# Patient Record
Sex: Male | Born: 1975 | Race: White | Hispanic: No | Marital: Single | Smoking: Former smoker
Health system: Southern US, Community
[De-identification: ages and names within clinical notes are randomized; demographics above are authoritative.]

## PROBLEM LIST (undated history)

## (undated) DIAGNOSIS — G373 Acute transverse myelitis in demyelinating disease of central nervous system: Secondary | ICD-10-CM

## (undated) DIAGNOSIS — N319 Neuromuscular dysfunction of bladder, unspecified: Secondary | ICD-10-CM

## (undated) HISTORY — DX: Acute transverse myelitis in demyelinating disease of central nervous system: G37.3

## (undated) HISTORY — DX: Neuromuscular dysfunction of bladder, unspecified: N31.9

---
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
## 2015-08-19 ENCOUNTER — Inpatient Hospital Stay (HOSPITAL_COMMUNITY)
Admission: EM | Admit: 2015-08-19 | Discharge: 2015-08-23 | DRG: 098 | Disposition: A | Discharge status: Home / Self Care | Payer: Commercial Managed Care - PPO | Attending: Internal Medicine | Admitting: Internal Medicine

## 2015-08-19 ENCOUNTER — Emergency Department (HOSPITAL_COMMUNITY): Payer: Commercial Managed Care - PPO

## 2015-08-19 ENCOUNTER — Encounter (HOSPITAL_COMMUNITY): Payer: Self-pay | Admitting: Emergency Medicine

## 2015-08-19 DIAGNOSIS — R200 Anesthesia of skin: Secondary | ICD-10-CM

## 2015-08-19 DIAGNOSIS — R531 Weakness: Secondary | ICD-10-CM | POA: Diagnosis present

## 2015-08-19 DIAGNOSIS — K5900 Constipation, unspecified: Secondary | ICD-10-CM | POA: Diagnosis present

## 2015-08-19 DIAGNOSIS — A770 Spotted fever due to Rickettsia rickettsii: Secondary | ICD-10-CM | POA: Diagnosis present

## 2015-08-19 DIAGNOSIS — G373 Acute transverse myelitis in demyelinating disease of central nervous system: Secondary | ICD-10-CM | POA: Diagnosis present

## 2015-08-19 DIAGNOSIS — R29898 Other symptoms and signs involving the musculoskeletal system: Secondary | ICD-10-CM

## 2015-08-19 LAB — TYPE AND SCREEN
ABO/RH(D): O POS
Antibody Screen: NEGATIVE

## 2015-08-19 LAB — COMPREHENSIVE METABOLIC PANEL
ALT: 16 U/L — AB (ref 17–63)
ANION GAP: 9 (ref 5–15)
AST: 17 U/L (ref 15–41)
Albumin: 4.1 g/dL (ref 3.5–5.0)
Alkaline Phosphatase: 46 U/L (ref 38–126)
BUN: 8 mg/dL (ref 6–20)
CHLORIDE: 111 mmol/L (ref 101–111)
CO2: 22 mmol/L (ref 22–32)
CREATININE: 0.87 mg/dL (ref 0.61–1.24)
Calcium: 9.3 mg/dL (ref 8.9–10.3)
Glucose, Bld: 101 mg/dL — ABNORMAL HIGH (ref 65–99)
Potassium: 4 mmol/L (ref 3.5–5.1)
Sodium: 142 mmol/L (ref 135–145)
Total Bilirubin: 0.8 mg/dL (ref 0.3–1.2)
Total Protein: 6.7 g/dL (ref 6.5–8.1)

## 2015-08-19 LAB — CBC WITH DIFFERENTIAL/PLATELET
Basophils Absolute: 0 10*3/uL (ref 0.0–0.1)
Basophils Relative: 1 %
EOS ABS: 0.1 10*3/uL (ref 0.0–0.7)
EOS PCT: 2 %
HCT: 42.6 % (ref 39.0–52.0)
Hemoglobin: 14.6 g/dL (ref 13.0–17.0)
LYMPHS ABS: 1.3 10*3/uL (ref 0.7–4.0)
LYMPHS PCT: 23 %
MCH: 31.1 pg (ref 26.0–34.0)
MCHC: 34.3 g/dL (ref 30.0–36.0)
MCV: 90.8 fL (ref 78.0–100.0)
MONO ABS: 0.5 10*3/uL (ref 0.1–1.0)
MONOS PCT: 9 %
Neutro Abs: 3.6 10*3/uL (ref 1.7–7.7)
Neutrophils Relative %: 65 %
PLATELETS: 230 10*3/uL (ref 150–400)
RBC: 4.69 MIL/uL (ref 4.22–5.81)
RDW: 13.1 % (ref 11.5–15.5)
WBC: 5.4 10*3/uL (ref 4.0–10.5)

## 2015-08-19 LAB — URINALYSIS, ROUTINE W REFLEX MICROSCOPIC
BILIRUBIN URINE: NEGATIVE
Glucose, UA: NEGATIVE mg/dL
HGB URINE DIPSTICK: NEGATIVE
Ketones, ur: NEGATIVE mg/dL
Leukocytes, UA: NEGATIVE
Nitrite: NEGATIVE
PH: 7.5 (ref 5.0–8.0)
Protein, ur: NEGATIVE mg/dL
SPECIFIC GRAVITY, URINE: 1.024 (ref 1.005–1.030)

## 2015-08-19 LAB — ABO/RH: ABO/RH(D): O POS

## 2015-08-19 LAB — PROTIME-INR
INR: 1.05 (ref 0.00–1.49)
PROTHROMBIN TIME: 13.9 s (ref 11.6–15.2)

## 2015-08-19 LAB — SEDIMENTATION RATE: Sed Rate: 2 mm/hr (ref 0–16)

## 2015-08-19 LAB — APTT: APTT: 28 s (ref 24–37)

## 2015-08-19 MED ORDER — DOXYCYCLINE HYCLATE 100 MG PO TABS: 100.0000 mg | ORAL_TABLET | Freq: Two times a day (BID) | ORAL | Status: DC

## 2015-08-19 MED ORDER — LIDOCAINE HCL (PF) 1 % IJ SOLN
INTRAMUSCULAR | Status: AC
  Administered 2015-08-19: 5 mL
  Filled 2015-08-19: qty 5

## 2015-08-19 MED ORDER — SODIUM CHLORIDE 0.9 % IV SOLN
500.0000 mg | Freq: Two times a day (BID) | INTRAVENOUS | Status: AC
  Administered 2015-08-19 – 2015-08-22 (×6): 500 mg via INTRAVENOUS
  Filled 2015-08-19 (×7): qty 4

## 2015-08-19 MED ORDER — GADOBENATE DIMEGLUMINE 529 MG/ML IV SOLN
18.0000 mL | Freq: Once | INTRAVENOUS | Status: AC | PRN
  Administered 2015-08-19: 18 mL via INTRAVENOUS

## 2015-08-19 MED ORDER — DOXYCYCLINE HYCLATE 100 MG PO TABS
100.0000 mg | ORAL_TABLET | Freq: Once | ORAL | Status: AC
  Administered 2015-08-19: 100 mg via ORAL
  Filled 2015-08-19: qty 1

## 2015-08-19 MED ORDER — HEPARIN SODIUM (PORCINE) 5000 UNIT/ML IJ SOLN
5000.0000 [IU] | Freq: Three times a day (TID) | INTRAMUSCULAR | Status: DC
  Administered 2015-08-19 – 2015-08-20 (×4): 5000 [IU] via SUBCUTANEOUS
  Filled 2015-08-19 (×5): qty 1

## 2015-08-19 MED ORDER — LIDOCAINE HCL (PF) 1 % IJ SOLN
5.0000 mL | Freq: Once | INTRAMUSCULAR | Status: AC
  Administered 2015-08-19: 5 mL

## 2015-08-19 MED ORDER — DOXYCYCLINE HYCLATE 100 MG IV SOLR
100.0000 mg | Freq: Two times a day (BID) | INTRAVENOUS | Status: DC
  Administered 2015-08-19 – 2015-08-22 (×7): 100 mg via INTRAVENOUS
  Filled 2015-08-19 (×9): qty 100

## 2015-08-19 NOTE — ED Notes (Addendum)
Patient reports feet started to be numbness in right foot to calf Sunday then worked up to bottom of rib cage (tingling) and feels like left side is asleep as well. Right side worse than left. No fevers; has not been sick lately. [facility]; he hunts and gets frequent ticks bites. Other work up was negative. Xray of spine ok. Reports back soreness (like pulled muscle) after stretching yesterday which prompted left side going numb.

## 2015-08-19 NOTE — H&P (Signed)
History and Physical    Kenneth Garza ZOX:[id number] DOB: 1976/03/24 DOA: 08/19/2015  Referring MD/NP/PA: Dr. Ethelda Garza PCP: No primary care provider on file. Outpatient Specialists: None Patient coming from: Home  Chief Complaint: BLE weakness, numbness  HPI: Kenneth Garza is a 40 y.o. male with medical history significant of previously healthy.  Patient presents to the ED with progressive numbness and weakness in BLE, abdomen and everywhere below T5.  Symptoms onset Sun, progressively worsening and developing some weakness today.  Still was able to walk in to ED today.  Did have RMSF positive titers drawn yesterday.  But denies fever, rash, or any other symptoms than weakness.  ED Course: LP attempted but EDP wasn't able to get.  Neurology consulted (see their note) and recommended hospitalist admission.  Review of Systems: As per HPI otherwise 10 point review of systems negative.    History reviewed. No pertinent past medical history.  History reviewed. No pertinent past surgical history.   reports that he has never smoked. He does not have any smokeless tobacco history on file. He reports that he does not drink alcohol or use illicit drugs.  No Known Allergies  History reviewed. No pertinent family history.   Prior to Admission medications   Medication Sig Start Date End Date Taking? Authorizing Provider  magnesium oxide (MAG-OX) 400 MG tablet Take 400 mg by mouth daily.   Yes Historical Provider, MD  naproxen (NAPROSYN) 500 MG tablet Take 500 mg by mouth 2 (two) times daily with a meal.   Yes Historical Provider, MD    Physical Exam: Filed Vitals:   08/19/15 1900 08/19/15 1915 08/19/15 1930 08/19/15 1945  BP: 127/80 124/70 140/70 129/84  Pulse: 57 27 57 47  Temp:      TempSrc:      Resp:      Height:      Weight:      SpO2: 98% 98% 99% 99%      Constitutional: NAD, calm, comfortable Filed Vitals:   08/19/15 1900 08/19/15 1915 08/19/15 1930 08/19/15 1945    BP: 127/80 124/70 140/70 129/84  Pulse: 57 27 57 47  Temp:      TempSrc:      Resp:      Height:      Weight:      SpO2: 98% 98% 99% 99%   Eyes: PERRL, lids and conjunctivae normal ENMT: Mucous membranes are moist. Posterior pharynx clear of any exudate or lesions.Normal dentition.  Neck: normal, supple, no masses, no thyromegaly Respiratory: clear to auscultation bilaterally, no wheezing, no crackles. Normal respiratory effort. No accessory muscle use.  Cardiovascular: Regular rate and rhythm, no murmurs / rubs / gallops. No extremity edema. 2+ pedal pulses. No carotid bruits.  Abdomen: no tenderness, no masses palpated. No hepatosplenomegaly. Bowel sounds positive.  Musculoskeletal: no clubbing / cyanosis. No joint deformity upper and lower extremities. Good ROM, no contractures. Normal muscle tone.  Skin: no rashes, lesions, ulcers. No induration Neurologic: CN 2-12 grossly intact. Sensation intact, DTR normal. Strength 5/5 in all 4.  Psychiatric: Normal judgment and insight. Alert and oriented x 3. Normal mood.    Labs on Admission: I have personally reviewed following labs and imaging studies  CBC:  Recent Labs Lab 08/19/15 1056  WBC 5.4  NEUTROABS 3.6  HGB 14.6  HCT 42.6  MCV 90.8  PLT 230   Basic Metabolic Panel:  Recent Labs Lab 08/19/15 1056  NA 142  K 4.0  CL 111  CO2 22  GLUCOSE 101*  BUN 8  CREATININE 0.87  CALCIUM 9.3   GFR: Estimated Creatinine Clearance: 117.7 mL/min (by C-G formula based on Cr of 0.87). Liver Function Tests:  Recent Labs Lab 08/19/15 1056  AST 17  ALT 16*  ALKPHOS 46  BILITOT 0.8  PROT 6.7  ALBUMIN 4.1   No results for input(s): LIPASE, AMYLASE in the last 168 hours. No results for input(s): AMMONIA in the last 168 hours. Coagulation Profile:  Recent Labs Lab 08/19/15 1056  INR 1.05   Cardiac Enzymes: No results for input(s): CKTOTAL, CKMB, CKMBINDEX, TROPONINI in the last 168 hours. BNP (last 3 results) No  results for input(s): PROBNP in the last 8760 hours. HbA1C: No results for input(s): HGBA1C in the last 72 hours. CBG: No results for input(s): GLUCAP in the last 168 hours. Lipid Profile: No results for input(s): CHOL, HDL, LDLCALC, TRIG, CHOLHDL, LDLDIRECT in the last 72 hours. Thyroid Function Tests: No results for input(s): TSH, T4TOTAL, FREET4, T3FREE, THYROIDAB in the last 72 hours. Anemia Panel: No results for input(s): VITAMINB12, FOLATE, FERRITIN, TIBC, IRON, RETICCTPCT in the last 72 hours. Urine analysis:    Component Value Date/Time   COLORURINE YELLOW 08/19/2015 1120   APPEARANCEUR HAZY* 08/19/2015 1120   LABSPEC 1.024 08/19/2015 1120   PHURINE 7.5 08/19/2015 1120   GLUCOSEU NEGATIVE 08/19/2015 1120   HGBUR NEGATIVE 08/19/2015 1120   BILIRUBINUR NEGATIVE 08/19/2015 1120   KETONESUR NEGATIVE 08/19/2015 1120   PROTEINUR NEGATIVE 08/19/2015 1120   NITRITE NEGATIVE 08/19/2015 1120   LEUKOCYTESUR NEGATIVE 08/19/2015 1120   Sepsis Labs: @LABRCNTIP (procalcitonin:4,lacticidven:4) )No results found for this or any previous visit (from the past 240 hour(s)).   Radiological Exams on Admission: Mr Cervical Spine W Wo Contrast  08/19/2015  CLINICAL DATA:  Acute onset of lower extremity numbness 3 days ago. Mid thoracic back pain. New diagnosis of [facility] spotted fever. EXAM: MRI TOTAL SPINE WITHOUT AND WITH CONTRAST TECHNIQUE: Multisequence MR imaging of the spine from the cervical spine to the sacrum was performed prior to and following IV contrast administration for evaluation of spinal metastatic disease. CONTRAST:  18mL MULTIHANCE GADOBENATE DIMEGLUMINE 529 MG/ML IV SOLN COMPARISON:  None. FINDINGS: Cervical Findings: The visualized intracranial contents and paraspinal soft tissues are normal. There is no mass lesion or myelopathy of the cervical spinal cord. No pathologic enhancement of the spinal cord after contrast administration. Facet joints are normal throughout the  cervical spine. Craniocervical junction through C2-3:  Normal. C3-4: Uncinate osteophytes slightly narrow the right neural foramen. Otherwise normal. C4-5: Small central subligamentous disc protrusion slightly indenting the ventral aspect of the spinal cord without myelopathy. Otherwise normal. C5-6: Disc degeneration with slight disc space narrowing asymmetric to the left. Broad-based disc osteophyte complex slightly compresses the spinal cord without myelopathy. Minimum AP dimension is 7 mm. Slight bilateral foraminal narrowing. Both lateral recesses are narrowed which could affect either or both C6 nerves. C6-7: Small broad-based disc osteophyte complex without impingement. C7-T1:  Normal. Thoracic Findings: There is a small disc protrusion just to the left of midline at T6-7 indenting the ventral aspect of the left side of the spinal cord without myelopathy. Tiny disc bulge at T7-8 with disc desiccation touching the ventral aspect of the spinal cord just to the left of midline. Disc degeneration at T8-9 without disc protrusion or bulge. Small benign hemangioma in the T12 vertebral body. Thoracic spinal cord has no mass lesion or myelopathy. No pathologic enhancement after contrast administration. Lumbar Findings: No conus tip  at T12-L1. No pathologic enhancement after contrast administration. Paraspinal soft tissues are normal. L1-2 through L3-4: Normal. L4-5: Disc desiccation with slight disc space narrowing with a tiny broad-based disc bulge with no neural impingement. L5-S1: Normal. Tiny hemangioma in the left side of the L5 vertebra. IMPRESSION: 1. No significant abnormality of the lumbar spine. Slight degenerative disc disease at L4-5. 2. Small disc protrusion at T6-7 to the left of midline slightly indenting the ventral aspect of the spinal cord without myelopathy. 3. 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 Minimal compression of the spinal cord at C5-6 without myelopathy. Electronically Signed    By: Francene Boyers M.D.   On: 08/19/2015 18:47   Mr Thoracic Spine W Wo Contrast  08/19/2015  CLINICAL DATA:  Acute onset of lower extremity numbness 3 days ago. Mid thoracic back pain. New diagnosis of [facility] spotted fever. EXAM: MRI TOTAL SPINE WITHOUT AND WITH CONTRAST TECHNIQUE: Multisequence MR imaging of the spine from the cervical spine to the sacrum was performed prior to and following IV contrast administration for evaluation of spinal metastatic disease. CONTRAST:  18mL MULTIHANCE GADOBENATE DIMEGLUMINE 529 MG/ML IV SOLN COMPARISON:  None. FINDINGS: Cervical Findings: The visualized intracranial contents and paraspinal soft tissues are normal. There is no mass lesion or myelopathy of the cervical spinal cord. No pathologic enhancement of the spinal cord after contrast administration. Facet joints are normal throughout the cervical spine. Craniocervical junction through C2-3:  Normal. C3-4: Uncinate osteophytes slightly narrow the right neural foramen. Otherwise normal. C4-5: Small central subligamentous disc protrusion slightly indenting the ventral aspect of the spinal cord without myelopathy. Otherwise normal. C5-6: Disc degeneration with slight disc space narrowing asymmetric to the left. Broad-based disc osteophyte complex slightly compresses the spinal cord without myelopathy. Minimum AP dimension is 7 mm. Slight bilateral foraminal narrowing. Both lateral recesses are narrowed which could affect either or both C6 nerves. C6-7: Small broad-based disc osteophyte complex without impingement. C7-T1:  Normal. Thoracic Findings: There is a small disc protrusion just to the left of midline at T6-7 indenting the ventral aspect of the left side of the spinal cord without myelopathy. Tiny disc bulge at T7-8 with disc desiccation touching the ventral aspect of the spinal cord just to the left of midline. Disc degeneration at T8-9 without disc protrusion or bulge. Small benign hemangioma in the T12  vertebral body. Thoracic spinal cord has no mass lesion or myelopathy. No pathologic enhancement after contrast administration. Lumbar Findings: No conus tip at T12-L1. No pathologic enhancement after contrast administration. Paraspinal soft tissues are normal. L1-2 through L3-4: Normal. L4-5: Disc desiccation with slight disc space narrowing with a tiny broad-based disc bulge with no neural impingement. L5-S1: Normal. Tiny hemangioma in the left side of the L5 vertebra. IMPRESSION: 1. No significant abnormality of the lumbar spine. Slight degenerative disc disease at L4-5. 2. Small disc protrusion at T6-7 to the left of midline slightly indenting the ventral aspect of the spinal cord without myelopathy. 3. 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 Minimal compression of the spinal cord at C5-6 without myelopathy. Electronically Signed   By: Francene Boyers M.D.   On: 08/19/2015 18:47   Mr Lumbar Spine W Wo Contrast  08/19/2015  CLINICAL DATA:  Acute onset of lower extremity numbness 3 days ago. Mid thoracic back pain. New diagnosis of [facility] spotted fever. EXAM: MRI TOTAL SPINE WITHOUT AND WITH CONTRAST TECHNIQUE: Multisequence MR imaging of the spine from the cervical spine to the  sacrum was performed prior to and following IV contrast administration for evaluation of spinal metastatic disease. CONTRAST:  18mL MULTIHANCE GADOBENATE DIMEGLUMINE 529 MG/ML IV SOLN COMPARISON:  None. FINDINGS: Cervical Findings: The visualized intracranial contents and paraspinal soft tissues are normal. There is no mass lesion or myelopathy of the cervical spinal cord. No pathologic enhancement of the spinal cord after contrast administration. Facet joints are normal throughout the cervical spine. Craniocervical junction through C2-3:  Normal. C3-4: Uncinate osteophytes slightly narrow the right neural foramen. Otherwise normal. C4-5: Small central subligamentous disc protrusion slightly indenting the ventral aspect  of the spinal cord without myelopathy. Otherwise normal. C5-6: Disc degeneration with slight disc space narrowing asymmetric to the left. Broad-based disc osteophyte complex slightly compresses the spinal cord without myelopathy. Minimum AP dimension is 7 mm. Slight bilateral foraminal narrowing. Both lateral recesses are narrowed which could affect either or both C6 nerves. C6-7: Small broad-based disc osteophyte complex without impingement. C7-T1:  Normal. Thoracic Findings: There is a small disc protrusion just to the left of midline at T6-7 indenting the ventral aspect of the left side of the spinal cord without myelopathy. Tiny disc bulge at T7-8 with disc desiccation touching the ventral aspect of the spinal cord just to the left of midline. Disc degeneration at T8-9 without disc protrusion or bulge. Small benign hemangioma in the T12 vertebral body. Thoracic spinal cord has no mass lesion or myelopathy. No pathologic enhancement after contrast administration. Lumbar Findings: No conus tip at T12-L1. No pathologic enhancement after contrast administration. Paraspinal soft tissues are normal. L1-2 through L3-4: Normal. L4-5: Disc desiccation with slight disc space narrowing with a tiny broad-based disc bulge with no neural impingement. L5-S1: Normal. Tiny hemangioma in the left side of the L5 vertebra. IMPRESSION: 1. No significant abnormality of the lumbar spine. Slight degenerative disc disease at L4-5. 2. Small disc protrusion at T6-7 to the left of midline slightly indenting the ventral aspect of the spinal cord without myelopathy. 3. 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 Minimal compression of the spinal cord at C5-6 without myelopathy. Electronically Signed   By: Francene BoyersJames  Maxwell M.D.   On: 08/19/2015 18:47    EKG: Independently reviewed.  Assessment/Plan Active Problems:   Transverse myelitis (HCC)  Transverse Myelitis -  Neurology consult in chart  6 doses of solumedrol 500mg  IV  Q12H ordered by neurology  PT/OT consults  Unclear significance of positive RMSF "titer"   On the off chance that RMSF is the cause of his transverse myelitis, I will put patient on IV doxycycline, 100mg  IV BID, with first dose now (got a PO dose of 100mg ) for a total load of 200mg  (as if we were treating full blown, life threatening RMSF).   Have ordered CSF titer for RMSF as well   None of the typical symptoms of RMSF noted (no fever, headache, rash, etc).   DVT prophylaxis: Heparin [place] Code Status: Full Family Communication: Family at bedside Consults called: Neurology, see Dr. Roseanne RenoStewart note in chart Admission status: Admit to inpatient   Hillary BowGARDNER, JARED M. DO [facility] Pager [phone] from 7PM-7AM  If 7AM-7PM, please contact the day physician for the patient [email or web address] Password [email or web address]  08/19/2015, 9:16 PM

## 2015-08-19 NOTE — ED Provider Notes (Signed)
CSN: [id number]     Arrival date & time 08/19/15  0912 History   First MD Initiated Contact with Patient 08/19/15 1006     Chief Complaint  Patient presents with  . Numbness     (Consider location/radiation/quality/duration/timing/severity/associated sxs/prior Treatment) HPI 40 year old male who presents with lower extremity numbness. He is otherwise healthy. He states that 3 days ago after waking up from sleep he developed right lower extremity numbness that extended from his foot up to the right hemiabdomen. States that yesterday while stretching in his car he noticed that he had some mid thoracic back pain and sudden onset of left leg numbness that descended up to his abdomen as well but less severe than the right. States that he also has developed numbness involving his testicles and weakness involving his right leg. States that he had routine blood work that was done by his physician one day ago, and was told that he was [facility] fever and called in for a course of doxycycline which she has not yet taken. States that he does want a lot, and often does have tick bites, but removes them almost immediately. Has not had any recent illnesses including nausea, vomiting, cough or other upper respiratory symptoms. Denies any difficulty breathing, arm numbness or weakness, headaches, vision changes, speech changes.    History reviewed. No pertinent past medical history. History reviewed. No pertinent past surgical history. History reviewed. No pertinent family history. Social History  Substance Use Topics  . Smoking status: Never Smoker   . Smokeless tobacco: None  . Alcohol Use: No    Review of Systems 10/14 systems reviewed and are negative other than those stated in the HPI   Allergies  Review of patient's allergies indicates no known allergies.  Home Medications   Prior to Admission medications   Medication Sig Start Date End Date Taking? Authorizing  Provider  magnesium oxide (MAG-OX) 400 MG tablet Take 400 mg by mouth daily.   Yes Historical Provider, MD  naproxen (NAPROSYN) 500 MG tablet Take 500 mg by mouth 2 (two) times daily with a meal.   Yes Historical Provider, MD   BP 144/85 mmHg  Pulse 61  Temp(Src) 98.6 F (37 C) (Oral)  Resp 18  SpO2 99% Physical Exam Physical Exam  Nursing note and vitals reviewed. Constitutional: Well developed, well nourished, non-toxic, and in no acute distress Head: Normocephalic and atraumatic.  Mouth/Throat: Oropharynx is clear and moist.  Neck: Normal range of motion. Neck supple.  Cardiovascular: Normal rate and regular rhythm.   Pulmonary/Chest: Effort normal and breath sounds normal.  Abdominal: Soft. There is no tenderness. There is no rebound and no guarding.  Musculoskeletal: Normal range of motion. Mild mid thoracic tenderness paraspinally. Skin: Skin is warm and dry.  Psychiatric: Cooperative Neurological:  Alert, oriented to person, place, time, and situation. Memory grossly in tact. Fluent speech. No dysarthria or aphasia.  Cranial nerves:Pupils are symmetric, and reactive to light. EOMI without nystagmus. No gaze deviation. Facial muscles symmetric with activation. Sensation to light touch over face in tact bilaterally. Hearing grossly in tact. Palate elevates symmetrically. Head turn and shoulder shrug are intact. Tongue midline.  +3 reflexes involving patellar, biceps, and brachial radialis reflexes.  Muscle bulk and tone normal. No pronator drift. 4+ out of 5 strength involving the right hip flexors and extensors, knee flexors and extensors, and ankle dorsi/plantar flexion. Full strength in bilateral upper extremities and left lower extremity. Saddle anesthesia present with intact rectal  tone. There is reportedly diminished sensation right greater than left lower extremity. Intact sensation to light touch involving bilateral upper extremities Coordination reveals no dysmetria with  finger to nose.   ED Course  Procedures (including critical care time) Labs Review Labs Reviewed  CBC WITH DIFFERENTIAL/PLATELET  PROTIME-INR  APTT  COMPREHENSIVE METABOLIC PANEL  SEDIMENTATION RATE  URINALYSIS, ROUTINE W REFLEX MICROSCOPIC (NOT AT [facility])  TYPE AND SCREEN    Imaging Review No results found. I have personally reviewed and evaluated these images and lab results as part of my medical decision-making.   EKG Interpretation None      MDM   Final diagnoses:  Numbness  Numbness  Numbness    40 year old male with presents with bilateral lower extremity numbness over past 3 days. On presentation with stable vital signs. He is well appearing. With diminished sensation up to level of about T6 bilaterally, but states right greater than left. With mild diminished strength in RLE and ? Hyperreflexia. Reports saddle anesthesia but rectal tone in tact and without urinary retention on PVR. With some mild back pain, in the mid thoracic region. Will perform MRI spine for evaluation of severe back or spine disease. ? Of relation to RMSF, but typically without focal neurological deficits. ? Of transverse myelitis vs GBS in setting of RMSF (although hyperreflexia may argue against GBS). Neurology consulted and MRI pending. Neurology recommending and MRI results signed out to oncoming physician.    Lavera Guise, MD 08/20/15 [phone]

## 2015-08-19 NOTE — ED Provider Notes (Signed)
Patient signed out to me. He complained of numbness and weakness in legs and up to chest. Has recently started treatment for [facility] spotted fever. Seen by Dr. Roseanne Reno in consultation recommends high-dose steroids and lumbar puncture. Dr. 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 Seizure note  Timeout performed consent obtained risks and benefits explained. Patient was laid in left lateral decubitus position skin was prepped with Betadine and draped L4-L5 interspace numb locally with 1% lidocaine. A #20-gauge spinal needle was introduced into the L4-L5 interspace,, no free fluid was able to be obtained. Needle was removed. He was placed in supine position. Patient tolerated procedure well . Dr. Julian Reil was consulted and arranged for inpatient stay. He will arrange for LP to be performed as inpatient. Results for orders placed or performed during the hospital encounter of 08/19/15  CBC with Differential  Result Value Ref Range   WBC 5.4 4.0 - 10.5 K/uL   RBC 4.69 4.22 - 5.81 MIL/uL   Hemoglobin 14.6 13.0 - 17.0 g/dL   HCT 16.1 09.6 - 04.5 %   MCV 90.8 78.0 - 100.0 fL   MCH 31.1 26.0 - 34.0 pg   MCHC 34.3 30.0 - 36.0 g/dL   RDW 40.9 81.1 - 91.4 %   Platelets 230 150 - 400 K/uL   Neutrophils Relative % 65 %   Neutro Abs 3.6 1.7 - 7.7 K/uL   Lymphocytes Relative 23 %   Lymphs Abs 1.3 0.7 - 4.0 K/uL   Monocytes Relative 9 %   Monocytes Absolute 0.5 0.1 - 1.0 K/uL   Eosinophils Relative 2 %   Eosinophils Absolute 0.1 0.0 - 0.7 K/uL   Basophils Relative 1 %   Basophils Absolute 0.0 0.0 - 0.1 K/uL  Comprehensive metabolic panel  Result Value Ref Range   Sodium 142 135 - 145 mmol/L   Potassium 4.0 3.5 - 5.1 mmol/L   Chloride 111 101 - 111 mmol/L   CO2 22 22 - 32 mmol/L   Glucose, Bld 101 (H) 65 - 99 mg/dL   BUN 8 6 - 20 mg/dL   Creatinine, Ser 7.82 0.61 - 1.24 mg/dL   Calcium 9.3 8.9 - 95.6 mg/dL   Total Protein 6.7 6.5 -  8.1 g/dL   Albumin 4.1 3.5 - 5.0 g/dL   AST 17 15 - 41 U/L   ALT 16 (L) 17 - 63 U/L   Alkaline Phosphatase 46 38 - 126 U/L   Total Bilirubin 0.8 0.3 - 1.2 mg/dL   GFR calc non Af Amer >60 >60 mL/min   GFR calc Af Amer >60 >60 mL/min   Anion gap 9 5 - 15  Protime-INR  Result Value Ref Range   Prothrombin Time 13.9 11.6 - 15.2 seconds   INR 1.05 0.00 - 1.49  APTT  Result Value Ref Range   aPTT 28 24 - 37 seconds  Sedimentation rate  Result Value Ref Range   Sed Rate 2 0 - 16 mm/hr  Urinalysis, Routine w reflex microscopic (not at [facility])  Result Value Ref Range   Color, Urine YELLOW YELLOW   APPearance HAZY (A) CLEAR   Specific Gravity, Urine 1.024 1.005 - 1.030   pH 7.5 5.0 - 8.0   Glucose, UA NEGATIVE NEGATIVE mg/dL   Hgb urine dipstick NEGATIVE NEGATIVE   Bilirubin Urine NEGATIVE NEGATIVE   Ketones, ur NEGATIVE NEGATIVE mg/dL   Protein, ur NEGATIVE NEGATIVE mg/dL   Nitrite NEGATIVE NEGATIVE  Leukocytes, UA NEGATIVE NEGATIVE  Type and screen [facility]  Result Value Ref Range   ABO/RH(D) O POS    Antibody Screen NEG    Sample Expiration 08/22/2015   ABO/Rh  Result Value Ref Range   ABO/RH(D) O POS    Mr Cervical Spine W Wo Contrast  08/19/2015  CLINICAL DATA:  Acute onset of lower extremity numbness 3 days ago. Mid thoracic back pain. [facility] EXAM: MRI TOTAL SPINE WITHOUT AND WITH CONTRAST TECHNIQUE: Multisequence MR imaging of the spine from the cervical spine to the sacrum was performed prior to and following IV contrast administration for evaluation of spinal metastatic disease. CONTRAST:  18mL MULTIHANCE GADOBENATE DIMEGLUMINE 529 MG/ML IV SOLN COMPARISON:  None. FINDINGS: Cervical Findings: The visualized intracranial contents and paraspinal soft tissues are normal. There is no mass lesion or myelopathy of the cervical spinal cord. No pathologic enhancement of the spinal cord after contrast administration. Facet  joints are normal throughout the cervical spine. Craniocervical junction through C2-3:  Normal. C3-4: Uncinate osteophytes slightly narrow the right neural foramen. Otherwise normal. C4-5: Small central subligamentous disc protrusion slightly indenting the ventral aspect of the spinal cord without myelopathy. Otherwise normal. C5-6: Disc degeneration with slight disc space narrowing asymmetric to the left. Broad-based disc osteophyte complex slightly compresses the spinal cord without myelopathy. Minimum AP dimension is 7 mm. Slight bilateral foraminal narrowing. Both lateral recesses are narrowed which could affect either or both C6 nerves. C6-7: Small broad-based disc osteophyte complex without impingement. C7-T1:  Normal. Thoracic Findings: There is a small disc protrusion just to the left of midline at T6-7 indenting the ventral aspect of the left side of the spinal cord without myelopathy. Tiny disc bulge at T7-8 with disc desiccation touching the ventral aspect of the spinal cord just to the left of midline. Disc degeneration at T8-9 without disc protrusion or bulge. Small benign hemangioma in the T12 vertebral body. Thoracic spinal cord has no mass lesion or myelopathy. No pathologic enhancement after contrast administration. Lumbar Findings: No conus tip at T12-L1. No pathologic enhancement after contrast administration. Paraspinal soft tissues are normal. L1-2 through L3-4: Normal. L4-5: Disc desiccation with slight disc space narrowing with a tiny broad-based disc bulge with no neural impingement. L5-S1: Normal. Tiny hemangioma in the left side of the L5 vertebra. IMPRESSION: 1. No significant abnormality of the lumbar spine. Slight degenerative disc disease at L4-5. 2. Small disc protrusion at T6-7 to the left of midline slightly indenting the ventral aspect of the spinal cord without myelopathy. 3. 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 Minimal compression of the spinal cord at C5-6 without  myelopathy. Electronically Signed   By: Francene BoyersJames  Maxwell M.D.   On: 08/19/2015 18:47   Mr Thoracic Spine W Wo Contrast  08/19/2015  CLINICAL DATA:  Acute onset of lower extremity numbness 3 days ago. Mid thoracic back pain. New diagnosis of [facility] spotted fever. EXAM: MRI TOTAL SPINE WITHOUT AND WITH CONTRAST TECHNIQUE: Multisequence MR imaging of the spine from the cervical spine to the sacrum was performed prior to and following IV contrast administration for evaluation of spinal metastatic disease. CONTRAST:  18mL MULTIHANCE GADOBENATE DIMEGLUMINE 529 MG/ML IV SOLN COMPARISON:  None. FINDINGS: Cervical Findings: The visualized intracranial contents and paraspinal soft tissues are normal. There is no mass lesion or myelopathy of the cervical spinal cord. No pathologic enhancement of the spinal cord after contrast administration. Facet joints are normal throughout  the cervical spine. Craniocervical junction through C2-3:  Normal. C3-4: Uncinate osteophytes slightly narrow the right neural foramen. Otherwise normal. C4-5: Small central subligamentous disc protrusion slightly indenting the ventral aspect of the spinal cord without myelopathy. Otherwise normal. C5-6: Disc degeneration with slight disc space narrowing asymmetric to the left. Broad-based disc osteophyte complex slightly compresses the spinal cord without myelopathy. Minimum AP dimension is 7 mm. Slight bilateral foraminal narrowing. Both lateral recesses are narrowed which could affect either or both C6 nerves. C6-7: Small broad-based disc osteophyte complex without impingement. C7-T1:  Normal. Thoracic Findings: There is a small disc protrusion just to the left of midline at T6-7 indenting the ventral aspect of the left side of the spinal cord without myelopathy. Tiny disc bulge at T7-8 with disc desiccation touching the ventral aspect of the spinal cord just to the left of midline. Disc degeneration at T8-9 without disc protrusion or bulge.  Small benign hemangioma in the T12 vertebral body. Thoracic spinal cord has no mass lesion or myelopathy. No pathologic enhancement after contrast administration. Lumbar Findings: No conus tip at T12-L1. No pathologic enhancement after contrast administration. Paraspinal soft tissues are normal. L1-2 through L3-4: Normal. L4-5: Disc desiccation with slight disc space narrowing with a tiny broad-based disc bulge with no neural impingement. L5-S1: Normal. Tiny hemangioma in the left side of the L5 vertebra. IMPRESSION: 1. No significant abnormality of the lumbar spine. Slight degenerative disc disease at L4-5. 2. Small disc protrusion at T6-7 to the left of midline slightly indenting the ventral aspect of the spinal cord without myelopathy. 3. 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 Minimal compression of the spinal cord at C5-6 without myelopathy. Electronically Signed   By: Francene Boyers M.D.   On: 08/19/2015 18:47   Mr Lumbar Spine W Wo Contrast  08/19/2015  CLINICAL DATA:  Acute onset of lower extremity numbness 3 days ago. Mid thoracic back pain. New diagnosis of [facility] spotted fever. EXAM: MRI TOTAL SPINE WITHOUT AND WITH CONTRAST TECHNIQUE: Multisequence MR imaging of the spine from the cervical spine to the sacrum was performed prior to and following IV contrast administration for evaluation of spinal metastatic disease. CONTRAST:  18mL MULTIHANCE GADOBENATE DIMEGLUMINE 529 MG/ML IV SOLN COMPARISON:  None. FINDINGS: Cervical Findings: The visualized intracranial contents and paraspinal soft tissues are normal. There is no mass lesion or myelopathy of the cervical spinal cord. No pathologic enhancement of the spinal cord after contrast administration. Facet joints are normal throughout the cervical spine. Craniocervical junction through C2-3:  Normal. C3-4: Uncinate osteophytes slightly narrow the right neural foramen. Otherwise normal. C4-5: Small central subligamentous disc protrusion  slightly indenting the ventral aspect of the spinal cord without myelopathy. Otherwise normal. C5-6: Disc degeneration with slight disc space narrowing asymmetric to the left. Broad-based disc osteophyte complex slightly compresses the spinal cord without myelopathy. Minimum AP dimension is 7 mm. Slight bilateral foraminal narrowing. Both lateral recesses are narrowed which could affect either or both C6 nerves. C6-7: Small broad-based disc osteophyte complex without impingement. C7-T1:  Normal. Thoracic Findings: There is a small disc protrusion just to the left of midline at T6-7 indenting the ventral aspect of the left side of the spinal cord without myelopathy. Tiny disc bulge at T7-8 with disc desiccation touching the ventral aspect of the spinal cord just to the left of midline. Disc degeneration at T8-9 without disc protrusion or bulge. Small benign hemangioma in the T12 vertebral body. Thoracic spinal cord has no mass lesion  or myelopathy. No pathologic enhancement after contrast administration. Lumbar Findings: No conus tip at T12-L1. No pathologic enhancement after contrast administration. Paraspinal soft tissues are normal. L1-2 through L3-4: Normal. L4-5: Disc desiccation with slight disc space narrowing with a tiny broad-based disc bulge with no neural impingement. L5-S1: Normal. Tiny hemangioma in the left side of the L5 vertebra. IMPRESSION: 1. No significant abnormality of the lumbar spine. Slight degenerative disc disease at L4-5. 2. Small disc protrusion at T6-7 to the left of midline slightly indenting the ventral aspect of the spinal cord without myelopathy. 3. 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 Minimal compression of the spinal cord at C5-6 without myelopathy. Electronically Signed   By: Francene Boyers M.D.   On: 08/19/2015 18:47     Doug Sou, MD 08/19/15 [id number]

## 2015-08-19 NOTE — ED Notes (Signed)
Neuro/STEWART paged to Dr. JShela Commons

## 2015-08-19 NOTE — ED Notes (Signed)
MD at bedside. 

## 2015-08-19 NOTE — Consult Note (Signed)
Admission H&P    Chief Complaint: Lower extremity weakness and numbness.  HPI: Kenneth Garza is an 40 y.o. male with no known medical illness other than kidney stones, presenting with new onset numbness involving lower extremities as well as right lower extremity weakness. Symptoms began with right lower extremity numbness 3 days ago. He developed left lower extremity numbness yesterday. He has also noticed progressive weakness involving right leg. He's had numbness in the sacral region, as well as abdomen and thoracic region up to mid chest bilaterally. He has not experienced pain, including no back pain or neck pain. He has not developed problems with control of bowel or bladder functioning. MRI of his thoracic cervical and lumbar spine showed no signs of an acute myelopathy or cauda equina abnormality. Degenerative changes involving cervical spine were noted. No significant cord compression was noted involving cervical and thoracic cord. Patient had blood work done for [employer] spotted fever and Lyme disease yesterday. The RMSF result was positive. Lyme test was negative. He has been started on doxycycline.  History reviewed. No pertinent past medical history.  History reviewed. No pertinent past surgical history.  History reviewed. No pertinent family history. Social History:  reports that he has never smoked. He does not have any smokeless tobacco history on file. He reports that he does not drink alcohol or use illicit drugs.  Allergies: No Known Allergies  Medications: Mag-Ox  ROS: History obtained from spouse and the patient  General ROS: negative for - chills, fatigue, fever, night sweats, weight gain or weight loss Psychological ROS: negative for - behavioral disorder, hallucinations, memory difficulties, mood swings or suicidal ideation Ophthalmic ROS: negative for - blurry vision, double vision, eye pain or loss of vision ENT ROS: negative for - epistaxis, nasal discharge,  oral lesions, sore throat, tinnitus or vertigo Allergy and Immunology ROS: negative for - hives or itchy/watery eyes Hematological and Lymphatic ROS: negative for - bleeding problems, bruising or swollen lymph nodes Endocrine ROS: negative for - galactorrhea, hair pattern changes, polydipsia/polyuria or temperature intolerance Respiratory ROS: negative for - cough, hemoptysis, shortness of breath or wheezing Cardiovascular ROS: negative for - chest pain, dyspnea on exertion, edema or irregular heartbeat Gastrointestinal ROS: negative for - abdominal pain, diarrhea, hematemesis, nausea/vomiting or stool incontinence Genito-Urinary ROS: negative for - dysuria, hematuria, incontinence or urinary frequency/urgency Musculoskeletal ROS: negative for - joint swelling or muscular weakness Neurological ROS: as noted in HPI Dermatological ROS: negative for rash and skin lesion changes  Physical Examination: Blood pressure 138/81, pulse 50, temperature 98.7 F (37.1 C), temperature source Oral, resp. rate 18, height '5\' 7"'$  (1.702 m), weight 87.091 kg (192 lb), SpO2 99 %.  HEENT-  Normocephalic, no lesions, without obvious abnormality.  Normal external eye and conjunctiva.  Normal TM's bilaterally.  Normal auditory canals and external ears. Normal external nose, mucus membranes and septum.  Normal pharynx. Neck supple with no masses, nodes, nodules or enlargement. Full range of motion with no resistance to neck flexion. Cardiovascular - regular rate and rhythm, S1, S2 normal, no murmur, click, rub or gallop Lungs - chest clear, no wheezing, rales, normal symmetric air entry Abdomen - soft, non-tender; bowel sounds normal; no masses,  no organomegaly Extremities - no joint deformities, effusion, or inflammation and no edema  Neurologic Examination: Mental Status: Alert, oriented, thought content appropriate.  Speech fluent without evidence of aphasia. Able to follow commands without difficulty. Cranial  Nerves: II-Visual fields were normal. III/IV/VI-Pupils were equal and reacted normally to light.  Extraocular movements were full and conjugate.    V/VII-no facial numbness and no facial weakness. VIII-normal. X-normal speech and symmetrical palatal movement. XI: trapezius strength/neck flexion strength normal bilaterally XII-midline tongue extension with normal strength. Motor: 4-/5 strength of right hip flexors; normal strength of left hip flexors; normal motor exam otherwise, including muscle tone throughout. Sensory: Reduced perception of tactile sensation below T5 bilaterally. There was reduced vibration distally in the left lower extremity compared to the right. He also had reduced position sense of great toes, right greater than left.. Deep Tendon Reflexes: 2+, brisk and symmetric. Plantars: Mute bilaterally Cerebellar: Normal finger-to-nose testing.  Results for orders placed or performed during the hospital encounter of 08/19/15 (from the past 48 hour(s))  Type and screen [place]     Status: None   Collection Time: 08/19/15 10:47 AM  Result Value Ref Range   ABO/RH(D) O POS    Antibody Screen NEG    Sample Expiration 08/22/2015   ABO/Rh     Status: None   Collection Time: 08/19/15 10:47 AM  Result Value Ref Range   ABO/RH(D) O POS   CBC with Differential     Status: None   Collection Time: 08/19/15 10:56 AM  Result Value Ref Range   WBC 5.4 4.0 - 10.5 K/uL   RBC 4.69 4.22 - 5.81 MIL/uL   Hemoglobin 14.6 13.0 - 17.0 g/dL   HCT 42.6 39.0 - 52.0 %   MCV 90.8 78.0 - 100.0 fL   MCH 31.1 26.0 - 34.0 pg   MCHC 34.3 30.0 - 36.0 g/dL   RDW 13.1 11.5 - 15.5 %   Platelets 230 150 - 400 K/uL   Neutrophils Relative % 65 %   Neutro Abs 3.6 1.7 - 7.7 K/uL   Lymphocytes Relative 23 %   Lymphs Abs 1.3 0.7 - 4.0 K/uL   Monocytes Relative 9 %   Monocytes Absolute 0.5 0.1 - 1.0 K/uL   Eosinophils Relative 2 %   Eosinophils Absolute 0.1 0.0 - 0.7 K/uL   Basophils  Relative 1 %   Basophils Absolute 0.0 0.0 - 0.1 K/uL  Comprehensive metabolic panel     Status: Abnormal   Collection Time: 08/19/15 10:56 AM  Result Value Ref Range   Sodium 142 135 - 145 mmol/L   Potassium 4.0 3.5 - 5.1 mmol/L   Chloride 111 101 - 111 mmol/L   CO2 22 22 - 32 mmol/L   Glucose, Bld 101 (H) 65 - 99 mg/dL   BUN 8 6 - 20 mg/dL   Creatinine, Ser 0.87 0.61 - 1.24 mg/dL   Calcium 9.3 8.9 - 10.3 mg/dL   Total Protein 6.7 6.5 - 8.1 g/dL   Albumin 4.1 3.5 - 5.0 g/dL   AST 17 15 - 41 U/L   ALT 16 (L) 17 - 63 U/L   Alkaline Phosphatase 46 38 - 126 U/L   Total Bilirubin 0.8 0.3 - 1.2 mg/dL   GFR calc non Af Amer >60 >60 mL/min   GFR calc Af Amer >60 >60 mL/min    Comment: (NOTE) The eGFR has been calculated using the CKD EPI equation. This calculation has not been validated in all clinical situations. eGFR's persistently <60 mL/min signify possible Chronic Kidney Disease.    Anion gap 9 5 - 15  Protime-INR     Status: None   Collection Time: 08/19/15 10:56 AM  Result Value Ref Range   Prothrombin Time 13.9 11.6 - 15.2 seconds  INR 1.05 0.00 - 1.49  APTT     Status: None   Collection Time: 08/19/15 10:56 AM  Result Value Ref Range   aPTT 28 24 - 37 seconds  Sedimentation rate     Status: None   Collection Time: 08/19/15 10:56 AM  Result Value Ref Range   Sed Rate 2 0 - 16 mm/hr  Urinalysis, Routine w reflex microscopic (not at [facility])     Status: Abnormal   Collection Time: 08/19/15 11:20 AM  Result Value Ref Range   Color, Urine YELLOW YELLOW   APPearance HAZY (A) CLEAR   Specific Gravity, Urine 1.024 1.005 - 1.030   pH 7.5 5.0 - 8.0   Glucose, UA NEGATIVE NEGATIVE mg/dL   Hgb urine dipstick NEGATIVE NEGATIVE   Bilirubin Urine NEGATIVE NEGATIVE   Ketones, ur NEGATIVE NEGATIVE mg/dL   Protein, ur NEGATIVE NEGATIVE mg/dL   Nitrite NEGATIVE NEGATIVE   Leukocytes, UA NEGATIVE NEGATIVE    Comment: MICROSCOPIC NOT DONE ON URINES WITH NEGATIVE PROTEIN, BLOOD,  LEUKOCYTES, NITRITE, OR GLUCOSE <1000 mg/dL.   Mr Cervical Spine W Wo Contrast  08/19/2015  CLINICAL DATA:  Acute onset of lower extremity numbness 3 days ago. Mid thoracic back pain. New diagnosis of [facility] spotted fever. EXAM: MRI TOTAL SPINE WITHOUT AND WITH CONTRAST TECHNIQUE: Multisequence MR imaging of the spine from the cervical spine to the sacrum was performed prior to and following IV contrast administration for evaluation of spinal metastatic disease. CONTRAST:  45m MULTIHANCE GADOBENATE DIMEGLUMINE 529 MG/ML IV SOLN COMPARISON:  None. FINDINGS: Cervical Findings: The visualized intracranial contents and paraspinal soft tissues are normal. There is no mass lesion or myelopathy of the cervical spinal cord. No pathologic enhancement of the spinal cord after contrast administration. Facet joints are normal throughout the cervical spine. Craniocervical junction through C2-3:  Normal. C3-4: Uncinate osteophytes slightly narrow the right neural foramen. Otherwise normal. C4-5: Small central subligamentous disc protrusion slightly indenting the ventral aspect of the spinal cord without myelopathy. Otherwise normal. C5-6: Disc degeneration with slight disc space narrowing asymmetric to the left. Broad-based disc osteophyte complex slightly compresses the spinal cord without myelopathy. Minimum AP dimension is 7 mm. Slight bilateral foraminal narrowing. Both lateral recesses are narrowed which could affect either or both C6 nerves. C6-7: Small broad-based disc osteophyte complex without impingement. C7-T1:  Normal. Thoracic Findings: There is a small disc protrusion just to the left of midline at T6-7 indenting the ventral aspect of the left side of the spinal cord without myelopathy. Tiny disc bulge at T7-8 with disc desiccation touching the ventral aspect of the spinal cord just to the left of midline. Disc degeneration at T8-9 without disc protrusion or bulge. Small benign hemangioma in the T12  vertebral body. Thoracic spinal cord has no mass lesion or myelopathy. No pathologic enhancement after contrast administration. Lumbar Findings: No conus tip at T12-L1. No pathologic enhancement after contrast administration. Paraspinal soft tissues are normal. L1-2 through L3-4: Normal. L4-5: Disc desiccation with slight disc space narrowing with a tiny broad-based disc bulge with no neural impingement. L5-S1: Normal. Tiny hemangioma in the left side of the L5 vertebra. IMPRESSION: 1. No significant abnormality of the lumbar spine. Slight degenerative disc disease at L4-5. 2. Small disc protrusion at T6-7 to the left of midline slightly indenting the ventral aspect of the spinal cord without myelopathy. 3. 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 Minimal compression of the spinal cord at C5-6 without myelopathy. Electronically Signed  By: Lorriane Shire M.D.   On: 08/19/2015 18:47   Mr Thoracic Spine W Wo Contrast  08/19/2015  CLINICAL DATA:  Acute onset of lower extremity numbness 3 days ago. Mid thoracic back pain. New diagnosis of [facility] spotted fever. EXAM: MRI TOTAL SPINE WITHOUT AND WITH CONTRAST TECHNIQUE: Multisequence MR imaging of the spine from the cervical spine to the sacrum was performed prior to and following IV contrast administration for evaluation of spinal metastatic disease. CONTRAST:  74m MULTIHANCE GADOBENATE DIMEGLUMINE 529 MG/ML IV SOLN COMPARISON:  None. FINDINGS: Cervical Findings: The visualized intracranial contents and paraspinal soft tissues are normal. There is no mass lesion or myelopathy of the cervical spinal cord. No pathologic enhancement of the spinal cord after contrast administration. Facet joints are normal throughout the cervical spine. Craniocervical junction through C2-3:  Normal. C3-4: Uncinate osteophytes slightly narrow the right neural foramen. Otherwise normal. C4-5: Small central subligamentous disc protrusion slightly indenting the ventral  aspect of the spinal cord without myelopathy. Otherwise normal. C5-6: Disc degeneration with slight disc space narrowing asymmetric to the left. Broad-based disc osteophyte complex slightly compresses the spinal cord without myelopathy. Minimum AP dimension is 7 mm. Slight bilateral foraminal narrowing. Both lateral recesses are narrowed which could affect either or both C6 nerves. C6-7: Small broad-based disc osteophyte complex without impingement. C7-T1:  Normal. Thoracic Findings: There is a small disc protrusion just to the left of midline at T6-7 indenting the ventral aspect of the left side of the spinal cord without myelopathy. Tiny disc bulge at T7-8 with disc desiccation touching the ventral aspect of the spinal cord just to the left of midline. Disc degeneration at T8-9 without disc protrusion or bulge. Small benign hemangioma in the T12 vertebral body. Thoracic spinal cord has no mass lesion or myelopathy. No pathologic enhancement after contrast administration. Lumbar Findings: No conus tip at T12-L1. No pathologic enhancement after contrast administration. Paraspinal soft tissues are normal. L1-2 through L3-4: Normal. L4-5: Disc desiccation with slight disc space narrowing with a tiny broad-based disc bulge with no neural impingement. L5-S1: Normal. Tiny hemangioma in the left side of the L5 vertebra. IMPRESSION: 1. No significant abnormality of the lumbar spine. Slight degenerative disc disease at L4-5. 2. Small disc protrusion at T6-7 to the left of midline slightly indenting the ventral aspect of the spinal cord without myelopathy. 3. 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 Minimal compression of the spinal cord at C5-6 without myelopathy. Electronically Signed   By: JLorriane ShireM.D.   On: 08/19/2015 18:47   Mr Lumbar Spine W Wo Contrast  08/19/2015  CLINICAL DATA:  Acute onset of lower extremity numbness 3 days ago. Mid thoracic back pain. [facility] EXAM: MRI TOTAL SPINE WITHOUT AND WITH CONTRAST TECHNIQUE: Multisequence MR imaging of the spine from the cervical spine to the sacrum was performed prior to and following IV contrast administration for evaluation of spinal metastatic disease. CONTRAST:  130mMULTIHANCE GADOBENATE DIMEGLUMINE 529 MG/ML IV SOLN COMPARISON:  None. FINDINGS: Cervical Findings: The visualized intracranial contents and paraspinal soft tissues are normal. There is no mass lesion or myelopathy of the cervical spinal cord. No pathologic enhancement of the spinal cord after contrast administration. Facet joints are normal throughout the cervical spine. Craniocervical junction through C2-3:  Normal. C3-4: Uncinate osteophytes slightly narrow the right neural foramen. Otherwise normal. C4-5: Small central subligamentous disc protrusion slightly indenting the ventral aspect of the spinal cord without myelopathy. Otherwise  normal. C5-6: Disc degeneration with slight disc space narrowing asymmetric to the left. Broad-based disc osteophyte complex slightly compresses the spinal cord without myelopathy. Minimum AP dimension is 7 mm. Slight bilateral foraminal narrowing. Both lateral recesses are narrowed which could affect either or both C6 nerves. C6-7: Small broad-based disc osteophyte complex without impingement. C7-T1:  Normal. Thoracic Findings: There is a small disc protrusion just to the left of midline at T6-7 indenting the ventral aspect of the left side of the spinal cord without myelopathy. Tiny disc bulge at T7-8 with disc desiccation touching the ventral aspect of the spinal cord just to the left of midline. Disc degeneration at T8-9 without disc protrusion or bulge. Small benign hemangioma in the T12 vertebral body. Thoracic spinal cord has no mass lesion or myelopathy. No pathologic enhancement after contrast administration. Lumbar Findings: No conus tip at T12-L1. No pathologic enhancement after contrast administration.  Paraspinal soft tissues are normal. L1-2 through L3-4: Normal. L4-5: Disc desiccation with slight disc space narrowing with a tiny broad-based disc bulge with no neural impingement. L5-S1: Normal. Tiny hemangioma in the left side of the L5 vertebra. IMPRESSION: 1. No significant abnormality of the lumbar spine. Slight degenerative disc disease at L4-5. 2. Small disc protrusion at T6-7 to the left of midline slightly indenting the ventral aspect of the spinal cord without myelopathy. 3. 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 Minimal compression of the spinal cord at C5-6 without myelopathy. Electronically Signed   By: Lorriane Shire M.D.   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 Etiology is unclear but most likely viral origin. Significance of positive [place] spotted fever titer is unclear.  Recommendations: 1. Lumbar puncture to rule out signs of myelitis, including elevated WBC count and routine 2. Solu-Medrol 500 mg IV every 12 hours for a total of 6 doses 3. Physical therapy consult 4. Continue doxycycline as planned  We will continue to follow this patient with you.  C.R. Nicole Kindred, [facility]  [phone]  08/19/2015, 8:15 PM

## 2015-08-19 NOTE — ED Notes (Signed)
Attempted report 

## 2015-08-20 ENCOUNTER — Inpatient Hospital Stay (HOSPITAL_COMMUNITY): Payer: Commercial Managed Care - PPO

## 2015-08-20 DIAGNOSIS — A770 Spotted fever due to Rickettsia rickettsii: Secondary | ICD-10-CM

## 2015-08-20 DIAGNOSIS — K5900 Constipation, unspecified: Secondary | ICD-10-CM

## 2015-08-20 LAB — CSF CELL COUNT WITH DIFFERENTIAL
RBC Count, CSF: 163 /mm3 — ABNORMAL HIGH
Tube #: 3
WBC CSF: 4 /mm3 (ref 0–5)

## 2015-08-20 LAB — PROTEIN, CSF: TOTAL PROTEIN, CSF: 64 mg/dL — AB (ref 15–45)

## 2015-08-20 LAB — GLUCOSE, CSF: GLUCOSE CSF: 78 mg/dL — AB (ref 40–70)

## 2015-08-20 MED ORDER — LIDOCAINE HCL (PF) 1 % IJ SOLN
INTRAMUSCULAR | Status: AC
  Administered 2015-08-20: 5 mL via INTRAMUSCULAR
  Filled 2015-08-20: qty 5

## 2015-08-20 MED ORDER — LORAZEPAM 0.5 MG PO TABS
0.5000 mg | ORAL_TABLET | Freq: Once | ORAL | Status: AC
  Administered 2015-08-21: 0.5 mg via ORAL
  Filled 2015-08-20: qty 1

## 2015-08-20 MED ORDER — SENNOSIDES-DOCUSATE SODIUM 8.6-50 MG PO TABS
2.0000 | ORAL_TABLET | Freq: Every evening | ORAL | Status: DC | PRN
  Administered 2015-08-20: 2 via ORAL
  Filled 2015-08-20: qty 2

## 2015-08-20 NOTE — Evaluation (Signed)
Physical Therapy Evaluation Patient Details Name: Kenneth HildingJeremy Garza MRN: [id number] DOB: [birth year] Today's Date: 08/20/2015   History of Present Illness  Patient is a 40 y/o male with no PMH presents with progressive numbness and weakness in BLE, abdomen and everywhere below T5. RMSF positive titers. Concern for transverse myelitis. LP performed. Awaiting results.  Clinical Impression  Patient presents with decreased sensation BLEs/abdomen and functional weakness in BLEs impacting safe mobility. Tolerated gait training with and without use of RW for support. Balance much improved with use of RW. Pt's spouse reports she can provide 24/7 initially if needed at home. Will follow acutely to maximize independence and mobility prior to return home.     Follow Up Recommendations Outpatient PT;Supervision - Intermittent (pending)    Equipment Recommendations  None recommended by PT    Recommendations for Other Services OT consult     Precautions / Restrictions Precautions Precautions: Fall Precaution Comments: Numbness T5 and below Restrictions Weight Bearing Restrictions: No      Mobility  Bed Mobility Overal bed mobility: Independent                Transfers Overall transfer level: Needs assistance Equipment used: None Transfers: Sit to/from Stand Sit to Stand: Supervision         General transfer comment: Supervision for safety. Stood from [employer] EnergyEOB x2.  Ambulation/Gait Ambulation/Gait assistance: Min assist;Supervision Ambulation Distance (Feet): 150 Feet (x2 bouts) Assistive device: Rolling walker (2 wheeled);None Gait Pattern/deviations: Wide base of support Gait velocity: decreased Gait velocity interpretation: Below normal speed for age/gender General Gait Details: Stifflike gait pattern with bil knee extension to prevent knee buckling. Left knee instability with gait but no overt buckling noted. Balance and gait mechanics much improved with use of RW. cues to decrease  speed and to use visual cues for foot placement to compensate for decreased sensation.  Stairs            Wheelchair Mobility    Modified Rankin (Stroke Patients Only)       Balance Overall balance assessment: Needs assistance Sitting-balance support: Feet supported;No upper extremity supported Sitting balance-Leahy Scale: Normal     Standing balance support: During functional activity Standing balance-Leahy Scale: Poor Standing balance comment: Reliant on UEs for support- able to stand unsupported but imbalance noted.                             Pertinent Vitals/Pain Pain Assessment: No/denies pain    Home Living Family/patient expects to be discharged to:: Private residence Living Arrangements: Spouse/significant other Available Help at Discharge: Family;Available 24 hours/day (wife can take off work) Type of Home: House Home Access: Stairs to enter   [employer] CorporationEntrance Stairs-Number of Steps: 2 (little steps) Home Layout: Two level;Laundry or work area in Pitney Bowesbasement Home Equipment: Environmental consultantWalker - 4 wheels;Walker - 2 wheels;Bedside commode      Prior Function Level of Independence: Independent               Hand Dominance        Extremity/Trunk Assessment   Upper Extremity Assessment: Defer to OT evaluation           Lower Extremity Assessment: RLE deficits/detail;LLE deficits/detail RLE Deficits / Details: Grossly ~4/5 knee extension, 2+/5 knee flexion, 3/5 DF. LLE Deficits / Details: Grossly 4-5/5 throughout.     Communication   Communication: No difficulties  Cognition Arousal/Alertness: Awake/alert Behavior During Therapy: WFL for tasks assessed/performed Overall Cognitive Status: Within Functional  Limits for tasks assessed                      General Comments General comments (skin integrity, edema, etc.): Wife present during session.    Exercises        Assessment/Plan    PT Assessment Patient needs continued PT  services  PT Diagnosis Generalized weakness;Difficulty walking;Abnormality of gait   PT Problem List Decreased strength;Impaired sensation;Decreased balance;Decreased mobility  PT Treatment Interventions Balance training;Gait training;Stair training;Functional mobility training;Therapeutic activities;Therapeutic exercise;Patient/family education;DME instruction   PT Goals (Current goals can be found in the Care Plan section) Acute Rehab PT Goals Patient Stated Goal: to return to independence PT Goal Formulation: With patient Time For Goal Achievement: 09/03/15 Potential to Achieve Goals: Good    Frequency Min 3X/week   Barriers to discharge Inaccessible home environment 2 steps    Co-evaluation               End of Session Equipment Utilized During Treatment: Gait belt Activity Tolerance: Patient tolerated treatment well Patient left: in bed;with call bell/phone within reach;with family/visitor present Nurse Communication: Mobility status         Time: 1610-9604 PT Time Calculation (min) (ACUTE ONLY): 24 min   Charges:   PT Evaluation $PT Eval Moderate Complexity: 1 Procedure PT Treatments $Gait Training: 8-22 mins   PT G Codes:        Analee Montee A Aamilah Augenstein 08/20/2015, 3:24 PM Mylo Red, PT, DPT [phone]

## 2015-08-20 NOTE — Progress Notes (Signed)
PT Cancellation Note  Patient Details Name: Kenneth Garza MRN: [id number] DOB: 1976-03-22   Cancelled Treatment:    Reason Eval/Treat Not Completed: Patient at procedure or test/unavailable Pt off floor for LP. Will follow up.    [name] 08/20/2015, 8:27 AM [name], PT, DPT [phone]

## 2015-08-20 NOTE — Progress Notes (Signed)
PROGRESS NOTE  Kenneth Garza  ZOX:[id number] DOB: 1975-04-28 DOA: 08/19/2015 PCP: No primary care provider on file. Outpatient Specialists:  None  Brief Narrative:   Kenneth Garza is a 40 y.o. male with no significant past medical history who presented to the ED with progressive numbness and weakness in BLE, abdomen and everywhere below T5. Symptoms onset Sun, progressively worsening, however, he was still able to walk into the ER.  He had associated decreased perineal sensation, but retained his ability to void and control his bladder.  Did have RMSF positive titers drawn the day prior to admission, however, he denied fever, rash, or any other symptoms than weakness.  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  GBS seems less likely given the predominant sensory component and more precise neurologic level -  MRI of the cervical, thoracic, and lumbar spine demonstrated no evidence of inflammation, mass, or ischemia -  LP:  No WBC or other cells.  Mildly elevated protein and glucose.  CSF sent for RMSF and for oligoclonal bands.   -  Appreciate radiology assistance  -  Appreciate Neurology assistance -  Continue solumedrol, scheduled for 6 total doses -  Will attempt to get more information regarding the RMSF titers from OSH tomorrow -  Continue doxycycline -  Look carefully for any residual ticks -  PT/OT  DVT prophylaxis:  lovenox Code Status:  full Family Communication:  Patient and his wife who was at bedside Disposition Plan:  Pending completion of steroids and improvement in symptoms   Consultants:   Neurology  Radiology for procedure  Procedures:  LP on 5/4  Antimicrobials:   Doxycycline 5/3     Subjective:  Lower extremity weakness has mostly resolved.  His numbness on the right side has also almost completely resolved, but he has some worsening numbness of the left leg up to the T5 level but not above.  Denies difficulty breathing, numbness or  weakness of arms or hands, or difficulty swallowing.  Able to void spontaneously.    Objective: Filed Vitals:   08/20/15 0543 08/20/15 0600 08/20/15 0942 08/20/15 1345  BP: 149/88  122/66 133/81  Pulse: 55  60 66  Temp: 97.9 F (36.6 C)  97.8 F (36.6 C) 98.9 F (37.2 C)  TempSrc: Oral  Oral Oral  Resp: 18  18 16   Height:  5\' 7"  (1.702 m)    Weight:  86.8 kg (191 lb 5.8 oz)    SpO2: 98%  97% 97%    Intake/Output Summary (Last 24 hours) at 08/20/15 1646 Last data filed at 08/20/15 1345  Gross per 24 hour  Intake    720 ml  Output      0 ml  Net    720 ml   Filed Weights   08/19/15 1137 08/19/15 2323 08/20/15 0600  Weight: 87.091 kg (192 lb) 86.3 kg (190 lb 4.1 oz) 86.8 kg (191 lb 5.8 oz)    Examination:  General exam:  Adult male.  No acute distress.  Appears calm and comfortable  HEENT:  NCAT, MMM Respiratory system: Clear to auscultation. Respiratory effort normal. Cardiovascular system: S1 & S2 heard, RRR. No JVD, murmurs, rubs, gallops or clicks.  Warm extremities Gastrointestinal system: Abdomen is nondistended, soft and nontender. No organomegaly or masses felt. Normal bowel sounds heard. Skin: No rashes, lesions or ulcers, no petechiae MSK:  Normal tone and bulk, no lower extremity edema Neuro:  Decreased sensation on left foot, leg, and up to  umbilicus.  Normal sensation on the right leg.  Strength 5/5 throughout.  CN II-XII grossly intact.   Psychiatry: Judgement and insight appear normal. Mood & affect appropriate.     Data Reviewed: I have personally reviewed following labs and imaging studies  CBC:  Recent Labs Lab 08/19/15 1056  WBC 5.4  NEUTROABS 3.6  HGB 14.6  HCT 42.6  MCV 90.8  PLT 230   Basic Metabolic Panel:  Recent Labs Lab 08/19/15 1056  NA 142  K 4.0  CL 111  CO2 22  GLUCOSE 101*  BUN 8  CREATININE 0.87  CALCIUM 9.3   GFR: Estimated Creatinine Clearance: 117.6 mL/min (by C-G formula based on Cr of 0.87). Liver Function  Tests:  Recent Labs Lab 08/19/15 1056  AST 17  ALT 16*  ALKPHOS 46  BILITOT 0.8  PROT 6.7  ALBUMIN 4.1   No results for input(s): LIPASE, AMYLASE in the last 168 hours. No results for input(s): AMMONIA in the last 168 hours. Coagulation Profile:  Recent Labs Lab 08/19/15 1056  INR 1.05   Cardiac Enzymes: No results for input(s): CKTOTAL, CKMB, CKMBINDEX, TROPONINI in the last 168 hours. BNP (last 3 results) No results for input(s): PROBNP in the last 8760 hours. HbA1C: No results for input(s): HGBA1C in the last 72 hours. CBG: No results for input(s): GLUCAP in the last 168 hours. Lipid Profile: No results for input(s): CHOL, HDL, LDLCALC, TRIG, CHOLHDL, LDLDIRECT in the last 72 hours. Thyroid Function Tests: No results for input(s): TSH, T4TOTAL, FREET4, T3FREE, THYROIDAB in the last 72 hours. Anemia Panel: No results for input(s): VITAMINB12, FOLATE, FERRITIN, TIBC, IRON, RETICCTPCT in the last 72 hours. Urine analysis:    Component Value Date/Time   COLORURINE YELLOW 08/19/2015 1120   APPEARANCEUR HAZY* 08/19/2015 1120   LABSPEC 1.024 08/19/2015 1120   PHURINE 7.5 08/19/2015 1120   GLUCOSEU NEGATIVE 08/19/2015 1120   HGBUR NEGATIVE 08/19/2015 1120   BILIRUBINUR NEGATIVE 08/19/2015 1120   KETONESUR NEGATIVE 08/19/2015 1120   PROTEINUR NEGATIVE 08/19/2015 1120   NITRITE NEGATIVE 08/19/2015 1120   LEUKOCYTESUR NEGATIVE 08/19/2015 1120   Sepsis Labs: @LABRCNTIP (procalcitonin:4,lacticidven:4)  ) Recent Results (from the past 240 hour(s))  CSF culture     Status: None (Preliminary result)   Collection Time: 08/20/15  9:04 AM  Result Value Ref Range Status   Specimen Description CSF  Final   Special Requests NONE  Final   Gram Stain   Final    WBC PRESENT, PREDOMINANTLY MONONUCLEAR NO ORGANISMS SEEN CYTOSPIN SMEAR    Culture PENDING  Incomplete   Report Status PENDING  Incomplete      Radiology Studies: Mr Cervical Spine W Wo Contrast  08/19/2015   CLINICAL DATA:  Acute onset of lower extremity numbness 3 days ago. Mid thoracic back pain. New diagnosis of [facility] spotted fever. EXAM: MRI TOTAL SPINE WITHOUT AND WITH CONTRAST TECHNIQUE: Multisequence MR imaging of the spine from the cervical spine to the sacrum was performed prior to and following IV contrast administration for evaluation of spinal metastatic disease. CONTRAST:  18mL MULTIHANCE GADOBENATE DIMEGLUMINE 529 MG/ML IV SOLN COMPARISON:  None. FINDINGS: Cervical Findings: The visualized intracranial contents and paraspinal soft tissues are normal. There is no mass lesion or myelopathy of the cervical spinal cord. No pathologic enhancement of the spinal cord after contrast administration. Facet joints are normal throughout the cervical spine. Craniocervical junction through C2-3:  Normal. C3-4: Uncinate osteophytes slightly narrow the right neural foramen. Otherwise normal. C4-5: Small central  subligamentous disc protrusion slightly indenting the ventral aspect of the spinal cord without myelopathy. Otherwise normal. C5-6: Disc degeneration with slight disc space narrowing asymmetric to the left. Broad-based disc osteophyte complex slightly compresses the spinal cord without myelopathy. Minimum AP dimension is 7 mm. Slight bilateral foraminal narrowing. Both lateral recesses are narrowed which could affect either or both C6 nerves. C6-7: Small broad-based disc osteophyte complex without impingement. C7-T1:  Normal. Thoracic Findings: There is a small disc protrusion just to the left of midline at T6-7 indenting the ventral aspect of the left side of the spinal cord without myelopathy. Tiny disc bulge at T7-8 with disc desiccation touching the ventral aspect of the spinal cord just to the left of midline. Disc degeneration at T8-9 without disc protrusion or bulge. Small benign hemangioma in the T12 vertebral body. Thoracic spinal cord has no mass lesion or myelopathy. No pathologic enhancement  after contrast administration. Lumbar Findings: No conus tip at T12-L1. No pathologic enhancement after contrast administration. Paraspinal soft tissues are normal. L1-2 through L3-4: Normal. L4-5: Disc desiccation with slight disc space narrowing with a tiny broad-based disc bulge with no neural impingement. L5-S1: Normal. Tiny hemangioma in the left side of the L5 vertebra. IMPRESSION: 1. No significant abnormality of the lumbar spine. Slight degenerative disc disease at L4-5. 2. Small disc protrusion at T6-7 to the left of midline slightly indenting the ventral aspect of the spinal cord without myelopathy. 3. 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 Minimal compression of the spinal cord at C5-6 without myelopathy. Electronically Signed   By: Francene Boyers M.D.   On: 08/19/2015 18:47   Mr Thoracic Spine W Wo Contrast  08/19/2015  CLINICAL DATA:  Acute onset of lower extremity numbness 3 days ago. Mid thoracic back pain. New diagnosis of [facility] spotted fever. EXAM: MRI TOTAL SPINE WITHOUT AND WITH CONTRAST TECHNIQUE: Multisequence MR imaging of the spine from the cervical spine to the sacrum was performed prior to and following IV contrast administration for evaluation of spinal metastatic disease. CONTRAST:  18mL MULTIHANCE GADOBENATE DIMEGLUMINE 529 MG/ML IV SOLN COMPARISON:  None. FINDINGS: Cervical Findings: The visualized intracranial contents and paraspinal soft tissues are normal. There is no mass lesion or myelopathy of the cervical spinal cord. No pathologic enhancement of the spinal cord after contrast administration. Facet joints are normal throughout the cervical spine. Craniocervical junction through C2-3:  Normal. C3-4: Uncinate osteophytes slightly narrow the right neural foramen. Otherwise normal. C4-5: Small central subligamentous disc protrusion slightly indenting the ventral aspect of the spinal cord without myelopathy. Otherwise normal. C5-6: Disc degeneration with slight  disc space narrowing asymmetric to the left. Broad-based disc osteophyte complex slightly compresses the spinal cord without myelopathy. Minimum AP dimension is 7 mm. Slight bilateral foraminal narrowing. Both lateral recesses are narrowed which could affect either or both C6 nerves. C6-7: Small broad-based disc osteophyte complex without impingement. C7-T1:  Normal. Thoracic Findings: There is a small disc protrusion just to the left of midline at T6-7 indenting the ventral aspect of the left side of the spinal cord without myelopathy. Tiny disc bulge at T7-8 with disc desiccation touching the ventral aspect of the spinal cord just to the left of midline. Disc degeneration at T8-9 without disc protrusion or bulge. Small benign hemangioma in the T12 vertebral body. Thoracic spinal cord has no mass lesion or myelopathy. No pathologic enhancement after contrast administration. Lumbar Findings: No conus tip at T12-L1. No pathologic enhancement after contrast administration. Paraspinal soft  tissues are normal. L1-2 through L3-4: Normal. L4-5: Disc desiccation with slight disc space narrowing with a tiny broad-based disc bulge with no neural impingement. L5-S1: Normal. Tiny hemangioma in the left side of the L5 vertebra. IMPRESSION: 1. No significant abnormality of the lumbar spine. Slight degenerative disc disease at L4-5. 2. Small disc protrusion at T6-7 to the left of midline slightly indenting the ventral aspect of the spinal cord without myelopathy. 3. 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 Minimal compression of the spinal cord at C5-6 without myelopathy. Electronically Signed   By: Francene Boyers M.D.   On: 08/19/2015 18:47   Mr Lumbar Spine W Wo Contrast  08/19/2015  CLINICAL DATA:  Acute onset of lower extremity numbness 3 days ago. Mid thoracic back pain. New diagnosis of [facility] spotted fever. EXAM: MRI TOTAL SPINE WITHOUT AND WITH CONTRAST TECHNIQUE: Multisequence MR imaging of the spine  from the cervical spine to the sacrum was performed prior to and following IV contrast administration for evaluation of spinal metastatic disease. CONTRAST:  18mL MULTIHANCE GADOBENATE DIMEGLUMINE 529 MG/ML IV SOLN COMPARISON:  None. FINDINGS: Cervical Findings: The visualized intracranial contents and paraspinal soft tissues are normal. There is no mass lesion or myelopathy of the cervical spinal cord. No pathologic enhancement of the spinal cord after contrast administration. Facet joints are normal throughout the cervical spine. Craniocervical junction through C2-3:  Normal. C3-4: Uncinate osteophytes slightly narrow the right neural foramen. Otherwise normal. C4-5: Small central subligamentous disc protrusion slightly indenting the ventral aspect of the spinal cord without myelopathy. Otherwise normal. C5-6: Disc degeneration with slight disc space narrowing asymmetric to the left. Broad-based disc osteophyte complex slightly compresses the spinal cord without myelopathy. Minimum AP dimension is 7 mm. Slight bilateral foraminal narrowing. Both lateral recesses are narrowed which could affect either or both C6 nerves. C6-7: Small broad-based disc osteophyte complex without impingement. C7-T1:  Normal. Thoracic Findings: There is a small disc protrusion just to the left of midline at T6-7 indenting the ventral aspect of the left side of the spinal cord without myelopathy. Tiny disc bulge at T7-8 with disc desiccation touching the ventral aspect of the spinal cord just to the left of midline. Disc degeneration at T8-9 without disc protrusion or bulge. Small benign hemangioma in the T12 vertebral body. Thoracic spinal cord has no mass lesion or myelopathy. No pathologic enhancement after contrast administration. Lumbar Findings: No conus tip at T12-L1. No pathologic enhancement after contrast administration. Paraspinal soft tissues are normal. L1-2 through L3-4: Normal. L4-5: Disc desiccation with slight disc space  narrowing with a tiny broad-based disc bulge with no neural impingement. L5-S1: Normal. Tiny hemangioma in the left side of the L5 vertebra. IMPRESSION: 1. No significant abnormality of the lumbar spine. Slight degenerative disc disease at L4-5. 2. Small disc protrusion at T6-7 to the left of midline slightly indenting the ventral aspect of the spinal cord without myelopathy. 3. 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 Minimal compression of the spinal cord at C5-6 without myelopathy. Electronically Signed   By: Francene Boyers M.D.   On: 08/19/2015 18:47   Dg Fluoro Guide Lumbar Puncture  08/20/2015  CLINICAL DATA:  Lower extremity numbness. Possible [facility] spotted fever. EXAM: DIAGNOSTIC LUMBAR PUNCTURE UNDER FLUOROSCOPIC GUIDANCE FLUOROSCOPY TIME:  Radiation Exposure Index (as provided by the fluoroscopic device): 2.7 mGy If the device does not provide the exposure index: Fluoroscopy Time (in minutes and seconds):  0 minutes and 30 seconds Number of  Acquired Images: PROCEDURE: Informed consent was obtained from the patient prior to the procedure, including potential complications of headache, allergy, and pain. With the patient prone, the lower back was prepped with Betadine. 1% Lidocaine was used for local anesthesia. Lumbar puncture was performed at the L3-4 level using a 20 gauge needle with return of initially bloody and and then clear CSF. 9 ml of CSF were obtained for laboratory studies. The patient tolerated the procedure well and there were no apparent complications. IMPRESSION: Fluoroscopic guided lumbar puncture with 9 cc of CSF obtained for appropriate laboratory evaluation. Electronically Signed   By: Rudie MeyerP.  Gallerani M.D.   On: 08/20/2015 09:51     Scheduled Meds: . doxycycline (VIBRAMYCIN) IV  100 mg Intravenous Q12H  . heparin  5,000 Units Subcutaneous Q8H  . methylPREDNISolone (SOLU-MEDROL) injection  500 mg Intravenous Q12H   Continuous Infusions:    LOS: 1 day     Time spent: 30 min    Renae FickleSHORT, Paige Monarrez, MD [facility] Pager [phone]  If 7PM-7AM, please contact night-coverage [email or web address] Password [email or web address] 08/20/2015, 4:46 PM

## 2015-08-20 NOTE — Evaluation (Signed)
Occupational Therapy Evaluation and Discharge Patient Details Name: Kenneth Garza MRN: [id number] DOB: February 07, 1976 Today's Date: 08/20/2015    History of Present Illness Patient is a 40 y/o male with no PMH presents with progressive numbness and weakness in BLE, abdomen and everywhere below T5. RMSF positive titers. Concern for transverse myelitis. LP performed. Awaiting results.   Clinical Impression   Pt reports he was independent with ADLs PTA. Currently pt overall min assist for functional mobility without use of AD, min guard for ADLs in standing, and setup for seated ADLs. Educated pt and his girlfriend on home safety and fall prevention; they verbalize understanding. Pt planning to d/c home with supervision as needed. No further acute OT needs identified; signing off at this time. Please re-consult if needs change. Thank you for this referral.    Follow Up Recommendations  No OT follow up;Supervision - Intermittent    Equipment Recommendations  Tub/shower seat    Recommendations for Other Services       Precautions / Restrictions Precautions Precautions: Fall Precaution Comments: Numbness T5 and below Restrictions Weight Bearing Restrictions: No      Mobility Bed Mobility Overal bed mobility: Independent             General bed mobility comments: Pt OOB in chair upon arrival.  Transfers Overall transfer level: Needs assistance Equipment used: None Transfers: Sit to/from Stand Sit to Stand: Min guard         General transfer comment: Min guard for safety. Pt unsteady initially but no LOB.    Balance Overall balance assessment: Needs assistance Sitting-balance support: Feet supported;No upper extremity supported Sitting balance-Leahy Scale: Normal     Standing balance support: No upper extremity supported;During functional activity Standing balance-Leahy Scale: Fair Standing balance comment: Reliant on UEs for support- able to stand unsupported but  imbalance noted.                            ADL Overall ADL's : Needs assistance/impaired Eating/Feeding: Independent;Sitting   Grooming: Min guard;Standing   Upper Body Bathing: Sitting;Set up   Lower Body Bathing: Min guard;Sit to/from stand   Upper Body Dressing : Sitting;Set up   Lower Body Dressing: Min guard;Sit to/from stand   Toilet Transfer: Minimal assistance;Ambulation;Regular Social workerToilet   Toileting- Clothing Manipulation and Hygiene: Min guard;Sit to/from stand       Functional mobility during ADLs: Minimal assistance General ADL Comments: Educated pt and girlfriend on home safety, use of shower seat for safety in tub, supervision for tub transfers.     Vision Vision Assessment?: No apparent visual deficits   Perception     Praxis      Pertinent Vitals/Pain Pain Assessment: No/denies pain     Hand Dominance     Extremity/Trunk Assessment Upper Extremity Assessment Upper Extremity Assessment: Overall WFL for tasks assessed   Lower Extremity Assessment Lower Extremity Assessment: Defer to PT evaluation RLE Deficits / Details: Grossly ~4/5 knee extension, 2+/5 knee flexion, 3/5 DF. RLE Sensation: decreased light touch RLE Coordination: decreased fine motor;decreased gross motor LLE Deficits / Details: Grossly 4-5/5 throughout. LLE Sensation: decreased light touch (proprioception WFL) LLE Coordination: decreased gross motor;decreased fine motor   Cervical / Trunk Assessment Cervical / Trunk Assessment: Normal   Communication Communication Communication: No difficulties   Cognition Arousal/Alertness: Awake/alert Behavior During Therapy: WFL for tasks assessed/performed Overall Cognitive Status: Within Functional Limits for tasks assessed  General Comments       Exercises       Shoulder Instructions      Home Living Family/patient expects to be discharged to:: Private residence Living Arrangements:  Spouse/significant other Available Help at Discharge: Family;Available 24 hours/day (girlfriend can take off work as needed) Type of Home: House Home Access: Stairs to enter [employer] of Steps: 2 (little steps)   Home Layout: Two level;Laundry or work area in basement;Able to live on main level with bedroom/bathroom Alternate Teacher, music of Steps: 1 flight to the basement  Alternate Level Stairs-Rails: Right;Left Bathroom Shower/Tub: Tub/shower unit Shower/tub characteristics: Engineer, building services: Standard     Home Equipment: Environmental consultant - 4 wheels;Walker - 2 wheels;Bedside commode;Shower seat          Prior Functioning/Environment Level of Independence: Independent        Comments: loads trucks for a living    OT Diagnosis: Generalized weakness   OT Problem List:     OT Treatment/Interventions:      OT Goals(Current goals can be found in the care plan section) Acute Rehab OT Goals Patient Stated Goal: to return to independence OT Goal Formulation: All assessment and education complete, DC therapy  OT Frequency:     Barriers to D/C:            Co-evaluation              End of Session Equipment Utilized During Treatment: Gait belt  Activity Tolerance: Patient tolerated treatment well Patient left: in chair;with call bell/phone within reach;with family/visitor present   Time: 3474-2595 OT Time Calculation (min): 14 min Charges:  OT General Charges $OT Visit: 1 Procedure OT Evaluation $OT Eval Low Complexity: 1 Procedure G-Codes:     Gaye Alken M.S., OTR/L Pager: [phone]  08/20/2015, 4:37 PM

## 2015-08-20 NOTE — Progress Notes (Signed)
OT Cancellation Note  Patient Details Name: Kenneth Garza MRN: [id number] DOB: 03-01-1976   Cancelled Treatment:    Reason Eval/Treat Not Completed: Patient at procedure or test/ unavailable (off the floor for LP). Will follow up for OT eval as time allows.  Gaye AlkenBailey A Oluwademilade Mckiver M.S., OTR/L Pager: [phone]  08/20/2015, 10:58 AM

## 2015-08-20 NOTE — Care Management Note (Signed)
Case Management Note  Patient Details  Name: Kenneth Garza MRN: [id number] Date of Birth: [birth year]  Subjective/Objective:                    Action/Plan: Patient was admitted with transverse myelitis. Will follow for discharge needs pending patient's progress and physician orders.  Expected Discharge Date:                  Expected Discharge Plan:     In-House Referral:     Discharge planning Services     Post Acute Care Choice:    Choice offered to:     DME Arranged:    DME Agency:     HH Arranged:    HH Agency:     Status of Service:  In process, will continue to follow  Medicare Important Message Given:    Date Medicare IM Given:    Medicare IM give by:    Date Additional Medicare IM Given:    Additional Medicare Important Message give by:     If discussed at Long Length of Stay Meetings, dates discussed:    Additional CommentsAnda Kraft:  [name], RN 08/20/2015, 9:24 AM [phone]

## 2015-08-20 NOTE — Progress Notes (Signed)
Patient c/o of bloated feeling. LBM 08/16/15, patient states he normally has 2-3 BM daily. MD notified. Patient also very tense and anxious. Patient states it is more than likely due to lack of sleep. Patients RLE sensation and movement improving. RN will continue to monitor patient.

## 2015-08-21 MED ORDER — BISACODYL 5 MG PO TBEC
5.0000 mg | DELAYED_RELEASE_TABLET | Freq: Every day | ORAL | Status: DC | PRN
  Administered 2015-08-21: 5 mg via ORAL
  Filled 2015-08-21: qty 1

## 2015-08-21 MED ORDER — DOCUSATE SODIUM 100 MG PO CAPS
100.0000 mg | ORAL_CAPSULE | Freq: Two times a day (BID) | ORAL | Status: DC
  Administered 2015-08-21 – 2015-08-22 (×4): 100 mg via ORAL
  Filled 2015-08-21 (×4): qty 1

## 2015-08-21 MED ORDER — ENOXAPARIN SODIUM 40 MG/0.4ML ~~LOC~~ SOLN
40.0000 mg | SUBCUTANEOUS | Status: DC
  Administered 2015-08-21 – 2015-08-22 (×2): 40 mg via SUBCUTANEOUS
  Filled 2015-08-21 (×2): qty 0.4

## 2015-08-21 MED ORDER — SENNA 8.6 MG PO TABS
2.0000 | ORAL_TABLET | Freq: Every day | ORAL | Status: DC
  Administered 2015-08-21 – 2015-08-22 (×2): 17.2 mg via ORAL
  Filled 2015-08-21 (×2): qty 2

## 2015-08-21 MED ORDER — POLYETHYLENE GLYCOL 3350 17 G PO PACK
17.0000 g | PACK | Freq: Every day | ORAL | Status: DC
  Administered 2015-08-21: 17 g via ORAL
  Filled 2015-08-21: qty 1

## 2015-08-21 NOTE — Progress Notes (Signed)
Subjective: Feels stronger but no change in sensation.   Exam: Filed Vitals:   08/21/15 0123 08/21/15 0635  BP: 121/72 130/62  Pulse: 72 56  Temp: 98.8 F (37.1 C) 97.9 F (36.6 C)  Resp: 16 18       Gen: In bed, NAD MS: alert and oriented CN: 2-12 intact Motor: 5/5 throughout Sensory: states he has decreased (but present) sensation from T5 and below. Interesting is that the sensory level is straight across and there is no slight dip as you hit midline as you would expect.  DTR: 2+ and brisk  Pertinent Labs/Diagnostics: none  [name] PA-C [facility] [phone]  Impression: 40 year old man presenting with probable acute transverse myelitis most likely affecting mid to upper thoracic region, with bilateral numbness below T5 level, as well as proximal weakness involving right lower extremity. Etiology is unclear but most likely viral origin. Significance of positive [place] spotted fever titer is unclear.   Recommendations: 1) Solumedrol for 3 more doses. PT    08/21/2015, 9:47 AM

## 2015-08-21 NOTE — Progress Notes (Signed)
Physical Therapy Treatment Patient Details Name: Kenneth Garza MRN: [id number] DOB: 07-15-1975 Today's Date: 08/21/2015    History of Present Illness Patient is a 40 y/o male with no PMH presents with progressive numbness and weakness in BLE, abdomen and everywhere below T5. RMSF positive titers. Concern for transverse myelitis. LP performed. Awaiting results.    PT Comments    Patient progressing with mobility. Reports improved sensation LLE today. Performed stair training with Min guard assist for safety. Encouraged ambulation with RN using RW and instructed pt in exercises to perform during the day. Continues to require BUE support for balance and left knee instability. Will follow.   Follow Up Recommendations  Outpatient PT;Supervision - Intermittent (pending improvement)     Equipment Recommendations  None recommended by PT    Recommendations for Other Services       Precautions / Restrictions Precautions Precautions: Fall Precaution Comments: Numbness T5 and below Restrictions Weight Bearing Restrictions: No    Mobility  Bed Mobility Overal bed mobility: Independent                Transfers Overall transfer level: Needs assistance Equipment used: Rolling walker (2 wheeled) Transfers: Sit to/from Stand Sit to Stand: Supervision         General transfer comment: Supervision for safety. Stood from [employer] x1. Transferred to chair post ambulation.  Ambulation/Gait Ambulation/Gait assistance: Min assist Ambulation Distance (Feet): 250 Feet Assistive device: Rolling walker (2 wheeled) Gait Pattern/deviations: Step-through pattern;Decreased stride length Gait velocity: decreased Gait velocity interpretation: Below normal speed for age/gender General Gait Details: Left knee instability noted at times. Balance and gait mechanics improved today. Cues to decrease speed. Does not need visual cues today.   Stairs Stairs: Yes Stairs assistance: Min guard Stair  Management: Two rails;Step to pattern;Alternating pattern Number of Stairs: 3 (+ 2 steps x2 bouts) General stair comments: Cues for technique and safety. No knee buckling noted.   Wheelchair Mobility    Modified Rankin (Stroke Patients Only)       Balance Overall balance assessment: Needs assistance Sitting-balance support: Feet supported;No upper extremity supported Sitting balance-Leahy Scale: Normal     Standing balance support: During functional activity Standing balance-Leahy Scale: Fair Standing balance comment: Reliant on BUEs for support. Attempted to perform SLS but required Mod A for balance. No knee buckling.                     Cognition Arousal/Alertness: Awake/alert Behavior During Therapy: WFL for tasks assessed/performed Overall Cognitive Status: Within Functional Limits for tasks assessed                      Exercises      General Comments General comments (skin integrity, edema, etc.): Family present during session.      Pertinent Vitals/Pain Pain Assessment: Faces Faces Pain Scale: Hurts a little bit Pain Location: right buttock and back Pain Descriptors / Indicators: Sharp Pain Intervention(s): Monitored during session;Repositioned    Home Living                      Prior Function            PT Goals (current goals can now be found in the care plan section) Progress towards PT goals: Progressing toward goals    Frequency  Min 3X/week    PT Plan Current plan remains appropriate    Co-evaluation  End of Session Equipment Utilized During Treatment: Gait belt Activity Tolerance: Patient tolerated treatment well Patient left: in chair;with call bell/phone within reach;with family/visitor present     Time: 1349-1410 PT Time Calculation (min) (ACUTE ONLY): 21 min  Charges:  $Gait Training: 8-22 mins                    G Codes:      Chaya Dehaan A Lezli Danek 08/21/2015, 2:45 PM Mylo Red,  PT, DPT [phone]

## 2015-08-21 NOTE — Progress Notes (Signed)
ANTICOAGULATION CONSULT NOTE - Initial Consult  Pharmacy Consult for Lovenox Indication: VTE prophylaxis  No Known Allergies  Patient Measurements: Height: 5\' 7"  (170.2 cm) Weight: 191 lb 5.8 oz (86.8 kg) IBW/kg (Calculated) : 66.1  Vital Signs: Temp: 97.9 F (36.6 C) (05/05 0635) Temp Source: Oral (05/05 0635) BP: 130/62 mmHg (05/05 0635) Pulse Rate: 56 (05/05 0635)  Labs:  Recent Labs  08/19/15 1056  HGB 14.6  HCT 42.6  PLT 230  APTT 28  LABPROT 13.9  INR 1.05  CREATININE 0.87    Estimated Creatinine Clearance: 117.6 mL/min (by C-G formula based on Cr of 0.87).   Medical History: History reviewed. No pertinent past medical history.  Assessment: 40 y.o. M presents with weakness/numbness in BLE. To begin Lovenox for VTE prophylaxis. Est CrCl 100 ml/min. CBC stable.  Goal of Therapy:  Prevention of VTE   Plan:  Lovenox 40mg  SQ q24h Pharmacy will sign off - please reconsult if needed  Christoper Fabianaron Burnett Lieber, PharmD, BCPS Clinical pharmacist, pager [phone] 08/21/2015,7:39 AM

## 2015-08-21 NOTE — Progress Notes (Signed)
PROGRESS NOTE  Kenneth Garza  ZOX:[id number] DOB: 1975/05/16 DOA: 08/19/2015 PCP: No primary care provider on file. Outpatient Specialists:  None  Brief Narrative:   Kenneth Garza is a 40 y.o. male with no significant past medical history who presented to the ED with progressive numbness and weakness in BLE, abdomen and everywhere below T5. Symptoms onset Sun, progressively worsening, however, he was still able to walk into the ER.  He had associated decreased perineal sensation, but retained his ability to void and control his bladder.  Did have RMSF positive titers drawn the day prior to admission, however, he denied fever, rash, or any other symptoms than weakness.  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  GBS seems less likely given the predominant sensory component and more precise neurologic level -  MRI of the cervical, thoracic, and lumbar spine demonstrated no evidence of inflammation, mass, or ischemia -  LP:  No WBC or other cells.  Mildly elevated protein and glucose.  CSF sent for RMSF and for oligoclonal bands.   -  Appreciate radiology assistance  -  Appreciate Neurology assistance -  Continue solumedrol, scheduled for 6 total doses, last dose tomorrow -  Continue doxycycline -  PT/OT:  Outpatient PT  DVT prophylaxis:  lovenox Code Status:  full Family Communication:  Patient and his wife who was at bedside Disposition Plan:  Pending completion of steroids and improvement in symptoms, results of CSF culture   Consultants:   Neurology  Radiology for procedure  Procedures:  LP on 5/4  Antimicrobials:   Doxycycline 5/3     Subjective:  Left lower extremity weakness has mostly resolved, mild right residual weakness.  His numbness on the right side has also almost completely resolved, but persistent numbness of the left leg up to the T5 level.  Denies difficulty breathing, numbness or weakness of arms or hands, or difficulty swallowing.  Able  to void spontaneously.  constipated  Objective: Filed Vitals:   08/21/15 0635 08/21/15 1005 08/21/15 1420 08/21/15 1840  BP: 130/62 135/75 130/78 134/76  Pulse: 56 82 79 76  Temp: 97.9 F (36.6 C) 98.4 F (36.9 C) 98 F (36.7 C) 98 F (36.7 C)  TempSrc: Oral Oral Oral Oral  Resp: 18 18 18 18   Height:      Weight:      SpO2: 98% 97% 98% 98%   No intake or output data in the 24 hours ending 08/21/15 1921 Filed Weights   08/19/15 1137 08/19/15 2323 08/20/15 0600  Weight: 87.091 kg (192 lb) 86.3 kg (190 lb 4.1 oz) 86.8 kg (191 lb 5.8 oz)    Examination:  General exam:  Adult male.  No acute distress.  Appears calm and comfortable  HEENT:  NCAT, MMM Respiratory system: Clear to auscultation. Respiratory effort normal. Cardiovascular system: S1 & S2 heard, RRR. No JVD, murmurs, rubs, gallops or clicks.  Warm extremities Gastrointestinal system: Abdomen is mildly distended, soft and nontender. No organomegaly or masses felt. Normal bowel sounds heard. Skin: No rashes, lesions or ulcers, no petechiae MSK:  Normal tone and bulk, no lower extremity edema Neuro:  Decreased sensation on left foot, leg, and up to umbilicus.  Normal sensation on the right leg.  Strength 5/5 throughout.  CN II-XII grossly intact.   Psychiatry: Judgement and insight appear normal. Mood & affect appropriate.     Data Reviewed: I have personally reviewed following labs and imaging studies  CBC:  Recent Labs Lab 08/19/15  1056  WBC 5.4  NEUTROABS 3.6  HGB 14.6  HCT 42.6  MCV 90.8  PLT 230   Basic Metabolic Panel:  Recent Labs Lab 08/19/15 1056  NA 142  K 4.0  CL 111  CO2 22  GLUCOSE 101*  BUN 8  CREATININE 0.87  CALCIUM 9.3   GFR: Estimated Creatinine Clearance: 117.6 mL/min (by C-G formula based on Cr of 0.87). Liver Function Tests:  Recent Labs Lab 08/19/15 1056  AST 17  ALT 16*  ALKPHOS 46  BILITOT 0.8  PROT 6.7  ALBUMIN 4.1   No results for input(s): LIPASE, AMYLASE in  the last 168 hours. No results for input(s): AMMONIA in the last 168 hours. Coagulation Profile:  Recent Labs Lab 08/19/15 1056  INR 1.05   Cardiac Enzymes: No results for input(s): CKTOTAL, CKMB, CKMBINDEX, TROPONINI in the last 168 hours. BNP (last 3 results) No results for input(s): PROBNP in the last 8760 hours. HbA1C: No results for input(s): HGBA1C in the last 72 hours. CBG: No results for input(s): GLUCAP in the last 168 hours. Lipid Profile: No results for input(s): CHOL, HDL, LDLCALC, TRIG, CHOLHDL, LDLDIRECT in the last 72 hours. Thyroid Function Tests: No results for input(s): TSH, T4TOTAL, FREET4, T3FREE, THYROIDAB in the last 72 hours. Anemia Panel: No results for input(s): VITAMINB12, FOLATE, FERRITIN, TIBC, IRON, RETICCTPCT in the last 72 hours. Urine analysis:    Component Value Date/Time   COLORURINE YELLOW 08/19/2015 1120   APPEARANCEUR HAZY* 08/19/2015 1120   LABSPEC 1.024 08/19/2015 1120   PHURINE 7.5 08/19/2015 1120   GLUCOSEU NEGATIVE 08/19/2015 1120   HGBUR NEGATIVE 08/19/2015 1120   BILIRUBINUR NEGATIVE 08/19/2015 1120   KETONESUR NEGATIVE 08/19/2015 1120   PROTEINUR NEGATIVE 08/19/2015 1120   NITRITE NEGATIVE 08/19/2015 1120   LEUKOCYTESUR NEGATIVE 08/19/2015 1120   Sepsis Labs: (procalcitonin:4,lacticidven:4)  ) Recent Results (from the past 240 hour(s))  CSF culture     Status: None (Preliminary result)   Collection Time: 08/20/15  9:04 AM  Result Value Ref Range Status   Specimen Description CSF  Final   Special Requests NONE  Final   Gram Stain   Final    WBC PRESENT, PREDOMINANTLY MONONUCLEAR NO ORGANISMS SEEN CYTOSPIN SMEAR    Culture NO GROWTH < 24 HOURS  Final   Report Status PENDING  Incomplete      Radiology Studies: Dg Fluoro Guide Lumbar Puncture  08/20/2015  CLINICAL DATA:  Lower extremity numbness. Possible [facility] spotted fever. EXAM: DIAGNOSTIC LUMBAR PUNCTURE UNDER FLUOROSCOPIC GUIDANCE FLUOROSCOPY  TIME:  Radiation Exposure Index (as provided by the fluoroscopic device): 2.7 mGy If the device does not provide the exposure index: Fluoroscopy Time (in minutes and seconds):  0 minutes and 30 seconds Number of Acquired Images: PROCEDURE: Informed consent was obtained from the patient prior to the procedure, including potential complications of headache, allergy, and pain. With the patient prone, the lower back was prepped with Betadine. 1% Lidocaine was used for local anesthesia. Lumbar puncture was performed at the L3-4 level using a 20 gauge needle with return of initially bloody and and then clear CSF. 9 ml of CSF were obtained for laboratory studies. The patient tolerated the procedure well and there were no apparent complications. IMPRESSION: Fluoroscopic guided lumbar puncture with 9 cc of CSF obtained for appropriate laboratory evaluation. Electronically Signed   By: Rudie Meyer M.D.   On: 08/20/2015 09:51     Scheduled Meds: . docusate sodium  100 mg Oral BID  .  doxycycline (VIBRAMYCIN) IV  100 mg Intravenous Q12H  . enoxaparin (LOVENOX) injection  40 mg Subcutaneous Q24H  . methylPREDNISolone (SOLU-MEDROL) injection  500 mg Intravenous Q12H  . polyethylene glycol  17 g Oral Daily  . senna  2 tablet Oral QHS   Continuous Infusions:    LOS: 2 days    Time spent: 30 min    Renae FickleSHORT, Kenneth Watlington, MD [facility] Pager [phone]  If 7PM-7AM, please contact night-coverage [email or web address] Password [email or web address] 08/21/2015, 7:21 PM

## 2015-08-22 DIAGNOSIS — A770 Spotted fever due to Rickettsia rickettsii: Secondary | ICD-10-CM

## 2015-08-22 DIAGNOSIS — K5900 Constipation, unspecified: Secondary | ICD-10-CM

## 2015-08-22 MED ORDER — BISACODYL 10 MG RE SUPP
10.0000 mg | Freq: Once | RECTAL | Status: AC
  Administered 2015-08-22: 10 mg via RECTAL
  Filled 2015-08-22: qty 1

## 2015-08-22 MED ORDER — DOXYCYCLINE HYCLATE 100 MG PO CAPS: 100.0000 mg | ORAL_CAPSULE | Freq: Two times a day (BID) | ORAL | Status: DC

## 2015-08-22 NOTE — Discharge Instructions (Signed)
Transverse Myelitis Transverse myelitis is a disorder of the spinal cord. Inflammation in the spinal cord can hurt the fatty substance (myelin) that covers the nerve fibers. This would be similar to losing the insulation around electrical wires. This damage causes problems with the nerve cells (neurons) in the spinal cord that carry signals from the brain to the rest of the body. The part of the spinal cord affected determines which parts of the body are affected. An injury to the spinal cord at the level of the chest can cause problems with leg movement and bowel and bladder control. An injury to the spinal cord at the level of the neck can cause problems with sensation and motor function of the arms, legs, and bowel and bladder control. If an injury is high enough in the neck, it may even affect breathing. Some patients recover from this disorder with minor or no lasting problems. Others may have lasting problems that make daily living difficult. Most patients will have only 1 episode of transverse myelitis. A small percentage may have problems return. CAUSES  The exact cause is not known. Some of the things thought to cause this problem include:  Viral infections, such as herpes, chickenpox (varicella zoster), cytomegalovirus, and Epstein-Barr virus.  Abnormal immune reactions.  Poor blood flow to the spinal cord. This disorder may also occur as a complication of:  Syphilis.  Measles.  Lyme disease.  Chickenpox, if severe.  Some vaccinations, including those for chickenpox and rabies. SYMPTOMS  Symptoms can come on over several hours to weeks. Transverse myelitis may cause a loss of spinal cord function over several hours to several weeks. Some symptoms are:  Headaches.  Fever.  Loss of appetite.  A sudden onset of lower back pain.  Abnormal sensations in the toes and feet.  Weakness in the arms and legs.  Bowel and urinary problems. DIAGNOSIS   Medical history and  neurological exam.  Magnetic resonance imaging (MRI). This procedure provides a picture of the brain and spinal cord.  Myelography. This procedure involves injecting a dye and taking X-rays.  Blood tests may be performed.  A spinal tap may be done to examine the spinal fluid. TREATMENT  No cure exists for this illness. Treatments are designed to help decrease the symptoms.  Corticosteroid treatment is often used during the first few weeks of illness. This decreases inflammation.  Pain medicines are prescribed as needed.  Sometimes, respirators are needed if there is severe difficulty breathing.  Physical therapy may be used to keep muscles flexible and strong.  Movement decreases the chances of pressure sores developing.  Later, if patients begin to recover limb control, physical therapy helps begin to improve muscle strength, coordination, and range of motion. HOME CARE INSTRUCTIONS  Much can be done for people with permanent disabilities.  Treatment programs and other resources are likely available in your community. Medical social workers can help you locate this information.  Rehabilitative therapy will teach you how to care for yourself. Rehabilitation cannot reverse the physical damage resulting from this illness. However, it can help people, even those with severe paralysis, become as independent as possible. This helps you attain the best possible quality of life.  Medicines can be prescribed by your caregiver. These medicines can help with depression and adapting to a changed way of life. A wide variety of drugs now exist that can help with the pain from spinal cord injuries.  Increasing your strength and endurance is possible. Treatment improves coordination and reduces   muscle spasms (spasticity) and muscle wasting in paralyzed limbs. Physical therapists can help you regain greater control over bladder and bowel function through various exercises. They can also teach  paralyzed patients techniques for using assistive devices (wheelchairs, canes, braces) as effectively as possible.  Learning new ways of performing everyday tasks is important. These tasks include bathing, dressing, preparing a meal, house cleaning, engaging in arts and crafts, or gardening. Occupational therapists can help you with this. FOR MORE INFORMATION National Institute of Neurological Disorders and Stroke: www.ninds.nih.gov Transverse Myelitis Association: www.myelitis.org American Chronic Pain Association: www.theacpa.org [place] Project to Cure Paralysis: www.themiamiproject.org National Rehabilitation Information Center: www.naric.com   This information is not intended to replace advice given to you by your health care provider. Make sure you discuss any questions you have with your health care provider.   Document Released: 03/25/2002 Document Revised: 06/27/2011 Document Reviewed: 09/10/2014 Elsevier Interactive Patient Education 2016 Elsevier Inc.  

## 2015-08-22 NOTE — Progress Notes (Signed)
Physical Therapy Treatment Patient Details Name: Kenneth Garza MRN: [id number] DOB: Mar 02, 1976 Today's Date: 08/22/2015    History of Present Illness Patient is a 40 y/o male with no PMH presents with progressive numbness and weakness in BLE, abdomen and everywhere below T5. RMSF positive titers. Concern for transverse myelitis. LP performed. Awaiting results.    PT Comments    Patient with improved gait mechanics and L knee stability this session. Continues to demo R LE weakness but with improved R dorsiflexion. Current plan remains appropriate.   Follow Up Recommendations        Equipment Recommendations       Recommendations for Other Services       Precautions / Restrictions Precautions Precautions: Fall Precaution Comments: Numbness T5 and below Restrictions Weight Bearing Restrictions: No    Mobility  Bed Mobility Overal bed mobility: Independent                Transfers Overall transfer level: Needs assistance Equipment used: Rolling walker (2 wheeled) Transfers: Sit to/from Stand Sit to Stand: Supervision         General transfer comment: supervision for safety; good safety awareness; instructed to make sure RW is close by before standing   Ambulation/Gait Ambulation/Gait assistance: Min guard Ambulation Distance (Feet): 250 Feet Assistive device: Rolling walker (2 wheeled) Gait Pattern/deviations: Step-through pattern;Decreased stride length Gait velocity: decreased   General Gait Details: pt with improved R dorsiflexion and L knee stability this session; cues for position of RW and for forward gaze   Stairs Stairs: Yes Stairs assistance: Min guard Stair Management: One rail Left;Forwards;Sideways Number of Stairs: 6 (3X2) General stair comments: cues for sequencing/technique; good safety awareness  Wheelchair Mobility    Modified Rankin (Stroke Patients Only)       Balance Overall balance assessment: Needs  assistance Sitting-balance support: No upper extremity supported;Feet supported Sitting balance-Leahy Scale: Normal     Standing balance support: During functional activity Standing balance-Leahy Scale: Fair                      Cognition Arousal/Alertness: Awake/alert Behavior During Therapy: WFL for tasks assessed/performed Overall Cognitive Status: Within Functional Limits for tasks assessed                      Exercises      General Comments General comments (skin integrity, edema, etc.): girlfriend present for session      Pertinent Vitals/Pain Pain Assessment: No/denies pain    Home Living                      Prior Function            PT Goals (current goals can now be found in the care plan section) Acute Rehab PT Goals Patient Stated Goal: to return to independence Progress towards PT goals: Progressing toward goals    Frequency       PT Plan      Co-evaluation             End of Session Equipment Utilized During Treatment: Gait belt Activity Tolerance: Patient tolerated treatment well Patient left: in bed;with call bell/phone within reach;with bed alarm set;with family/visitor present     Time: [id number] PT Time Calculation (min) (ACUTE ONLY): 20 min  Charges:  $Gait Training: 8-22 mins                    G Codes:  Derek [place]ound, PTA Pager: [phone]   08/22/2015, 12:41 PM

## 2015-08-22 NOTE — Discharge Summary (Signed)
Physician Discharge Summary  Kenneth Garza ZOX:[id number] DOB: [birth year] DOA: 08/19/2015  PCP: No primary care provider on file.  Admit date: 08/19/2015 Discharge date: 08/22/2015  Recommendations for Outpatient Follow-up:  1. Follow-up with the neurologist within 1 month or sooner if needed 2. [facility] for follow up of RMSF titer  Discharge Diagnoses:  Active Problems:   Transverse myelitis (HCC)   RMSF [facility] spotted fever)   Constipation   Discharge Condition: Stable, improved  Diet recommendation: Regular  Wt Readings from Last 3 Encounters:  08/20/15 86.8 kg (191 lb 5.8 oz)    History of present illness:  Kenneth Garza is a 40 y.o. male with no significant past medical history who presented to the ED with progressive numbness and weakness in BLE, abdomen and everywhere below T5. Symptoms onset Sun, progressively worsening, however, he was still able to walk into the ER. He had associated decreased perineal sensation, but retained his ability to void and control his bladder. Did have RMSF positive titers drawn the day prior to admission, however, he denied fever, rash, or any other symptoms than weakness.  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  Neurology was consulted.  His MRI of the cervical, thoracic, and lumbar spine demonstrated no evidence of inflammation, mass, or ischemia, however, his symptoms were subtle and he had a mild elevation of his protein on his LP.  His spinal fluid was sent for RMSF and for oligoclonal bands. The oligoclonal band is pending at the time of discharge.  He also had a NMO Ab sent which is pending.  He was given Solu-Medrol 500 mg IV to 12 hours 6 doses. He had gradual improvement in the strength of both of his legs although he was still 5 minus out of 5 in the right lower extremity. His sensation mostly came back and his right lower extremity but he still had some numbness in his left lower  extremity. He was able to void spontaneously. He had some constipation which was relieved with stool softeners and laxatives. He worked with physical therapy recommended outpatient physical therapy and use of a rolling walker.  Possible RMSF -  The patient was bitten by several ticks three weeks ago, which he promptly removed.  He was seen at [facility] who drew RMSF titers which were reportedly positive.  They notified him of his positive test results and called in a prescription for doxycycline.  He exhibited no fever, headache, rash, or thrombocytopenia.  He was given doxycycline starting 5/3 and has a prescription called in a local pharmacy which he should complete.    Constipation, resolved with miralax, colace, and bisacodyl  Consultants:   Neurology  Radiology for procedure  Procedures:  LP on 5/4  Antimicrobials:   Doxycycline 5/3   Discharge Exam: Filed Vitals:   08/22/15 0610 08/22/15 0955  BP: 117/66 144/70  Pulse: 63   Temp: 98.2 F (36.8 C) 98 F (36.7 C)  Resp: 18 18   Filed Vitals:   08/21/15 2106 08/22/15 0147 08/22/15 0610 08/22/15 0955  BP: 148/73 134/70 117/66 144/70  Pulse: 64 57 63   Temp: 98.2 F (36.8 C) 98.1 F (36.7 C) 98.2 F (36.8 C) 98 F (36.7 C)  TempSrc: Oral Oral Oral Oral  Resp: 18 18 18 18   Height:      Weight:      SpO2: 97% 98% 99% 95%    General exam: Adult male. No acute distress. Appears  calm and comfortable  HEENT: NCAT, MMM Respiratory system: Clear to auscultation. Respiratory effort normal. Cardiovascular system: S1 & S2 heard, RRR. No JVD, murmurs, rubs, gallops or clicks. Warm extremities Gastrointestinal system: Abdomen is mildly distended, soft and nontender. No organomegaly or masses felt. Normal bowel sounds heard. Skin: No rashes, lesions or ulcers, no petechiae Neuro: Decreased sensation on left foot, leg, and up to umbilicus. Normal sensation on the right leg. Strength 5/5 throughout. CN  II-XII grossly intact.  Psychiatry: Judgement and insight appear normal. Mood & affect appropriate.   Discharge Instructions      Discharge Instructions    Ambulatory referral to Physical Therapy    Complete by:  As directed   Iontophoresis - 4 mg/ml of dexamethasone:  No  T.E.N.S. Unit Evaluation and Dispense as Indicated:  No            Medication List    STOP taking these medications        naproxen 500 MG tablet  Commonly known as:  NAPROSYN      TAKE these medications        doxycycline 100 MG capsule  Commonly known as:  VIBRAMYCIN  Take 1 capsule (100 mg total) by mouth 2 (two) times daily.     magnesium oxide 400 MG tablet  Commonly known as:  MAG-OX  Take 400 mg by mouth daily.       Follow-up Information    Follow up with Neurology. Schedule an appointment as soon as possible for a visit in 2 weeks.       The results of significant diagnostics from this hospitalization (including imaging, microbiology, ancillary and laboratory) are listed below for reference.    Significant Diagnostic Studies: Mr Cervical Spine W Wo Contrast  08/19/2015  CLINICAL DATA:  Acute onset of lower extremity numbness 3 days ago. Mid thoracic back pain. New diagnosis of [facility] spotted fever. EXAM: MRI TOTAL SPINE WITHOUT AND WITH CONTRAST TECHNIQUE: Multisequence MR imaging of the spine from the cervical spine to the sacrum was performed prior to and following IV contrast administration for evaluation of spinal metastatic disease. CONTRAST:  18mL MULTIHANCE GADOBENATE DIMEGLUMINE 529 MG/ML IV SOLN COMPARISON:  None. FINDINGS: Cervical Findings: The visualized intracranial contents and paraspinal soft tissues are normal. There is no mass lesion or myelopathy of the cervical spinal cord. No pathologic enhancement of the spinal cord after contrast administration. Facet joints are normal throughout the cervical spine. Craniocervical junction through C2-3:  Normal. C3-4: Uncinate  osteophytes slightly narrow the right neural foramen. Otherwise normal. C4-5: Small central subligamentous disc protrusion slightly indenting the ventral aspect of the spinal cord without myelopathy. Otherwise normal. C5-6: Disc degeneration with slight disc space narrowing asymmetric to the left. Broad-based disc osteophyte complex slightly compresses the spinal cord without myelopathy. Minimum AP dimension is 7 mm. Slight bilateral foraminal narrowing. Both lateral recesses are narrowed which could affect either or both C6 nerves. C6-7: Small broad-based disc osteophyte complex without impingement. C7-T1:  Normal. Thoracic Findings: There is a small disc protrusion just to the left of midline at T6-7 indenting the ventral aspect of the left side of the spinal cord without myelopathy. Tiny disc bulge at T7-8 with disc desiccation touching the ventral aspect of the spinal cord just to the left of midline. Disc degeneration at T8-9 without disc protrusion or bulge. Small benign hemangioma in the T12 vertebral body. Thoracic spinal cord has no mass lesion or myelopathy. No pathologic enhancement after contrast administration. Lumbar  Findings: No conus tip at T12-L1. No pathologic enhancement after contrast administration. Paraspinal soft tissues are normal. L1-2 through L3-4: Normal. L4-5: Disc desiccation with slight disc space narrowing with a tiny broad-based disc bulge with no neural impingement. L5-S1: Normal. Tiny hemangioma in the left side of the L5 vertebra. IMPRESSION: 1. No significant abnormality of the lumbar spine. Slight degenerative disc disease at L4-5. 2. Small disc protrusion at T6-7 to the left of midline slightly indenting the ventral aspect of the spinal cord without myelopathy. 3. 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 Minimal compression of the spinal cord at C5-6 without myelopathy. Electronically Signed   By: Francene Boyers M.D.   On: 08/19/2015 18:47   Mr Thoracic Spine W Wo  Contrast  08/19/2015  CLINICAL DATA:  Acute onset of lower extremity numbness 3 days ago. Mid thoracic back pain. New diagnosis of [facility] spotted fever. EXAM: MRI TOTAL SPINE WITHOUT AND WITH CONTRAST TECHNIQUE: Multisequence MR imaging of the spine from the cervical spine to the sacrum was performed prior to and following IV contrast administration for evaluation of spinal metastatic disease. CONTRAST:  18mL MULTIHANCE GADOBENATE DIMEGLUMINE 529 MG/ML IV SOLN COMPARISON:  None. FINDINGS: Cervical Findings: The visualized intracranial contents and paraspinal soft tissues are normal. There is no mass lesion or myelopathy of the cervical spinal cord. No pathologic enhancement of the spinal cord after contrast administration. Facet joints are normal throughout the cervical spine. Craniocervical junction through C2-3:  Normal. C3-4: Uncinate osteophytes slightly narrow the right neural foramen. Otherwise normal. C4-5: Small central subligamentous disc protrusion slightly indenting the ventral aspect of the spinal cord without myelopathy. Otherwise normal. C5-6: Disc degeneration with slight disc space narrowing asymmetric to the left. Broad-based disc osteophyte complex slightly compresses the spinal cord without myelopathy. Minimum AP dimension is 7 mm. Slight bilateral foraminal narrowing. Both lateral recesses are narrowed which could affect either or both C6 nerves. C6-7: Small broad-based disc osteophyte complex without impingement. C7-T1:  Normal. Thoracic Findings: There is a small disc protrusion just to the left of midline at T6-7 indenting the ventral aspect of the left side of the spinal cord without myelopathy. Tiny disc bulge at T7-8 with disc desiccation touching the ventral aspect of the spinal cord just to the left of midline. Disc degeneration at T8-9 without disc protrusion or bulge. Small benign hemangioma in the T12 vertebral body. Thoracic spinal cord has no mass lesion or myelopathy. No  pathologic enhancement after contrast administration. Lumbar Findings: No conus tip at T12-L1. No pathologic enhancement after contrast administration. Paraspinal soft tissues are normal. L1-2 through L3-4: Normal. L4-5: Disc desiccation with slight disc space narrowing with a tiny broad-based disc bulge with no neural impingement. L5-S1: Normal. Tiny hemangioma in the left side of the L5 vertebra. IMPRESSION: 1. No significant abnormality of the lumbar spine. Slight degenerative disc disease at L4-5. 2. Small disc protrusion at T6-7 to the left of midline slightly indenting the ventral aspect of the spinal cord without myelopathy. 3. 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 Minimal compression of the spinal cord at C5-6 without myelopathy. Electronically Signed   By: Francene Boyers M.D.   On: 08/19/2015 18:47   Mr Lumbar Spine W Wo Contrast  08/19/2015  CLINICAL DATA:  Acute onset of lower extremity numbness 3 days ago. Mid thoracic back pain. New diagnosis of [facility] spotted fever. EXAM: MRI TOTAL SPINE WITHOUT AND WITH CONTRAST TECHNIQUE: Multisequence MR imaging of the spine from the  cervical spine to the sacrum was performed prior to and following IV contrast administration for evaluation of spinal metastatic disease. CONTRAST:  18mL MULTIHANCE GADOBENATE DIMEGLUMINE 529 MG/ML IV SOLN COMPARISON:  None. FINDINGS: Cervical Findings: The visualized intracranial contents and paraspinal soft tissues are normal. There is no mass lesion or myelopathy of the cervical spinal cord. No pathologic enhancement of the spinal cord after contrast administration. Facet joints are normal throughout the cervical spine. Craniocervical junction through C2-3:  Normal. C3-4: Uncinate osteophytes slightly narrow the right neural foramen. Otherwise normal. C4-5: Small central subligamentous disc protrusion slightly indenting the ventral aspect of the spinal cord without myelopathy. Otherwise normal. C5-6: Disc  degeneration with slight disc space narrowing asymmetric to the left. Broad-based disc osteophyte complex slightly compresses the spinal cord without myelopathy. Minimum AP dimension is 7 mm. Slight bilateral foraminal narrowing. Both lateral recesses are narrowed which could affect either or both C6 nerves. C6-7: Small broad-based disc osteophyte complex without impingement. C7-T1:  Normal. Thoracic Findings: There is a small disc protrusion just to the left of midline at T6-7 indenting the ventral aspect of the left side of the spinal cord without myelopathy. Tiny disc bulge at T7-8 with disc desiccation touching the ventral aspect of the spinal cord just to the left of midline. Disc degeneration at T8-9 without disc protrusion or bulge. Small benign hemangioma in the T12 vertebral body. Thoracic spinal cord has no mass lesion or myelopathy. No pathologic enhancement after contrast administration. Lumbar Findings: No conus tip at T12-L1. No pathologic enhancement after contrast administration. Paraspinal soft tissues are normal. L1-2 through L3-4: Normal. L4-5: Disc desiccation with slight disc space narrowing with a tiny broad-based disc bulge with no neural impingement. L5-S1: Normal. Tiny hemangioma in the left side of the L5 vertebra. IMPRESSION: 1. No significant abnormality of the lumbar spine. Slight degenerative disc disease at L4-5. 2. Small disc protrusion at T6-7 to the left of midline slightly indenting the ventral aspect of the spinal cord without myelopathy. 3. 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 Minimal compression of the spinal cord at C5-6 without myelopathy. Electronically Signed   By: Francene Boyers M.D.   On: 08/19/2015 18:47   Dg Fluoro Guide Lumbar Puncture  08/20/2015  CLINICAL DATA:  Lower extremity numbness. Possible [facility] spotted fever. EXAM: DIAGNOSTIC LUMBAR PUNCTURE UNDER FLUOROSCOPIC GUIDANCE FLUOROSCOPY TIME:  Radiation Exposure Index (as provided by the  fluoroscopic device): 2.7 mGy If the device does not provide the exposure index: Fluoroscopy Time (in minutes and seconds):  0 minutes and 30 seconds Number of Acquired Images: PROCEDURE: Informed consent was obtained from the patient prior to the procedure, including potential complications of headache, allergy, and pain. With the patient prone, the lower back was prepped with Betadine. 1% Lidocaine was used for local anesthesia. Lumbar puncture was performed at the L3-4 level using a 20 gauge needle with return of initially bloody and and then clear CSF. 9 ml of CSF were obtained for laboratory studies. The patient tolerated the procedure well and there were no apparent complications. IMPRESSION: Fluoroscopic guided lumbar puncture with 9 cc of CSF obtained for appropriate laboratory evaluation. Electronically Signed   By: Rudie Meyer M.D.   On: 08/20/2015 09:51    Microbiology: Recent Results (from the past 240 hour(s))  CSF culture     Status: None (Preliminary result)   Collection Time: 08/20/15  9:04 AM  Result Value Ref Range Status   Specimen Description CSF  Final  Special Requests NONE  Final   Gram Stain   Final    WBC PRESENT, PREDOMINANTLY MONONUCLEAR NO ORGANISMS SEEN CYTOSPIN SMEAR    Culture NO GROWTH 2 DAYS  Final   Report Status PENDING  Incomplete     Labs: Basic Metabolic Panel:  Recent Labs Lab 08/19/15 1056  NA 142  K 4.0  CL 111  CO2 22  GLUCOSE 101*  BUN 8  CREATININE 0.87  CALCIUM 9.3   Liver Function Tests:  Recent Labs Lab 08/19/15 1056  AST 17  ALT 16*  ALKPHOS 46  BILITOT 0.8  PROT 6.7  ALBUMIN 4.1   No results for input(s): LIPASE, AMYLASE in the last 168 hours. No results for input(s): AMMONIA in the last 168 hours. CBC:  Recent Labs Lab 08/19/15 1056  WBC 5.4  NEUTROABS 3.6  HGB 14.6  HCT 42.6  MCV 90.8  PLT 230   Cardiac Enzymes: No results for input(s): CKTOTAL, CKMB, CKMBINDEX, TROPONINI in the last 168  hours. BNP: BNP (last 3 results) No results for input(s): BNP in the last 8760 hours.  ProBNP (last 3 results) No results for input(s): PROBNP in the last 8760 hours.  CBG: No results for input(s): GLUCAP in the last 168 hours.  Time coordinating discharge: 35 minutes  Signed:  Beckett Hickmon  [facility] 08/22/2015, 3:03 PM

## 2015-08-22 NOTE — Progress Notes (Signed)
Subjective: Continues to improve  Exam: Filed Vitals:   08/22/15 0610 08/22/15 0955  BP: 117/66 144/70  Pulse: 63   Temp: 98.2 F (36.8 C) 98 F (36.7 C)  Resp: 18 18   Gen: In bed, NAD Resp: non-labored breathing, no acute distress Abd: soft, nt  Neuro: MS: awake, alert,  ZO:XWRUCN:EOMI, PERRL Motor: mild righ tleg weakness Sensory:decreaased below mid-thoracic level.   Impression: 40 yo M with symptoms consistent with transverse myelitis. Thoguh no findings on imaging, he doe shave an elevated protein in the CSF. OC bands pending. With relatively mild symptoms and improvement, I would not favor further aggressive treatment unless he were to worsen. I will send nmo antibodies, though with mild symptoms I think less likely. If he were to have further symptoms, then would consider further workup at that time.   Recommendations: 1) Finish IV steroids.  2) continue PT 3) f/u with neurology as outpatient.   Ritta SlotMcNeill Amayra Kiedrowski, MD [facility] [phone]  If 7pm- 7am, please page neurology on call as listed in AMION.

## 2015-08-23 LAB — CSF CULTURE: CULTURE: NO GROWTH

## 2015-08-23 LAB — CSF CULTURE W GRAM STAIN

## 2015-08-23 NOTE — Progress Notes (Signed)
Pt discharged home with significant other. IV removed. Discharge instructions and PT prescription given. Patient questions asked and answered. Will leave unit with girlfriend when she arrives. Lawson RadarHeather M Kamla Skilton

## 2015-08-24 LAB — OLIGOCLONAL BANDS, CSF + SERM

## 2015-08-24 LAB — NEUROMYELITIS OPTICA AUTOAB, IGG

## 2015-08-25 LAB — MISC LABCORP TEST (SEND OUT): Labcorp test code: 828719

## 2015-09-02 ENCOUNTER — Encounter: Payer: Self-pay | Admitting: Neurology

## 2015-09-02 ENCOUNTER — Ambulatory Visit (INDEPENDENT_AMBULATORY_CARE_PROVIDER_SITE_OTHER): Payer: Commercial Managed Care - PPO | Admitting: Neurology

## 2015-09-02 VITALS — BP 142/82 | HR 62 | Ht 67.0 in | Wt 196.2 lb

## 2015-09-02 DIAGNOSIS — G373 Acute transverse myelitis in demyelinating disease of central nervous system: Secondary | ICD-10-CM

## 2015-09-02 NOTE — Patient Instructions (Signed)
Transverse Myelitis Transverse myelitis is a disorder of the spinal cord. Inflammation in the spinal cord can hurt the fatty substance (myelin) that covers the nerve fibers. This would be similar to losing the insulation around electrical wires. This damage causes problems with the nerve cells (neurons) in the spinal cord that carry signals from the brain to the rest of the body. The part of the spinal cord affected determines which parts of the body are affected. An injury to the spinal cord at the level of the chest can cause problems with leg movement and bowel and bladder control. An injury to the spinal cord at the level of the neck can cause problems with sensation and motor function of the arms, legs, and bowel and bladder control. If an injury is high enough in the neck, it may even affect breathing. Some patients recover from this disorder with minor or no lasting problems. Others may have lasting problems that make daily living difficult. Most patients will have only 1 episode of transverse myelitis. A small percentage may have problems return. CAUSES  The exact cause is not known. Some of the things thought to cause this problem include:  Viral infections, such as herpes, chickenpox (varicella zoster), cytomegalovirus, and Epstein-Barr virus.  Abnormal immune reactions.  Poor blood flow to the spinal cord. This disorder may also occur as a complication of:  Syphilis.  Measles.  Lyme disease.  Chickenpox, if severe.  Some vaccinations, including those for chickenpox and rabies. SYMPTOMS  Symptoms can come on over several hours to weeks. Transverse myelitis may cause a loss of spinal cord function over several hours to several weeks. Some symptoms are:  Headaches.  Fever.  Loss of appetite.  A sudden onset of lower back pain.  Abnormal sensations in the toes and feet.  Weakness in the arms and legs.  Bowel and urinary problems. DIAGNOSIS   Medical history and  neurological exam.  Magnetic resonance imaging (MRI). This procedure provides a picture of the brain and spinal cord.  Myelography. This procedure involves injecting a dye and taking X-rays.  Blood tests may be performed.  A spinal tap may be done to examine the spinal fluid. TREATMENT  No cure exists for this illness. Treatments are designed to help decrease the symptoms.  Corticosteroid treatment is often used during the first few weeks of illness. This decreases inflammation.  Pain medicines are prescribed as needed.  Sometimes, respirators are needed if there is severe difficulty breathing.  Physical therapy may be used to keep muscles flexible and strong.  Movement decreases the chances of pressure sores developing.  Later, if patients begin to recover limb control, physical therapy helps begin to improve muscle strength, coordination, and range of motion. HOME CARE INSTRUCTIONS  Much can be done for people with permanent disabilities.  Treatment programs and other resources are likely available in your community. Medical social workers can help you locate this information.  Rehabilitative therapy will teach you how to care for yourself. Rehabilitation cannot reverse the physical damage resulting from this illness. However, it can help people, even those with severe paralysis, become as independent as possible. This helps you attain the best possible quality of life.  Medicines can be prescribed by your caregiver. These medicines can help with depression and adapting to a changed way of life. A wide variety of drugs now exist that can help with the pain from spinal cord injuries.  Increasing your strength and endurance is possible. Treatment improves coordination and reduces   muscle spasms (spasticity) and muscle wasting in paralyzed limbs. Physical therapists can help you regain greater control over bladder and bowel function through various exercises. They can also teach  paralyzed patients techniques for using assistive devices (wheelchairs, canes, braces) as effectively as possible.  Learning new ways of performing everyday tasks is important. These tasks include bathing, dressing, preparing a meal, house cleaning, engaging in arts and crafts, or gardening. Occupational therapists can help you with this. FOR MORE INFORMATION National Institute of Neurological Disorders and Stroke: www.ninds.nih.gov Transverse Myelitis Association: www.myelitis.org American Chronic Pain Association: www.theacpa.org [place] Project to Cure Paralysis: www.themiamiproject.org National Rehabilitation Information Center: www.naric.com   This information is not intended to replace advice given to you by your health care provider. Make sure you discuss any questions you have with your health care provider.   Document Released: 03/25/2002 Document Revised: 06/27/2011 Document Reviewed: 09/10/2014 Elsevier Interactive Patient Education 2016 [employer]  

## 2015-09-02 NOTE — Progress Notes (Signed)
Reason for visit: Transverse myelitis  Referring physician: [facility]  Kenneth Garza is a 40 y.o. male  History of present illness:  Kenneth Garza is a 40 year old right-handed white male right-handed white male with onset of numbness involving the right leg that began around 08/16/2015. Within 8 hours, the numbness had moved up to the hip level, and within the next 2 days, the numbness progressed up to the T4 level on the body, and eventually also involved both sides of the body. The patient has had some weakness of the right lower extremity, difficulty with walking. The patient has not had any falls. He has had some constipation issues, but no difficulty controlling the bowels or the bladder. He went to his primary doctor, and underwent blood work that included a panel for [facility] spotted fever and Lyme disease. The Lyme panel was reportedly negative, the [facility] spotted fever panel was positive. The patient was placed on antibodies. Because of worsening of deficits, the patient eventually went to the hospital and was admitted. A [place] spotted fever panel was rechecked, and was negative. The patient had NMO antibodies that were negative. Lumbar puncture was done and showed no inflammatory cells, but the protein level was slightly elevated, oligoclonal banding was negative. The patient was treated with steroids, and he seemed to improve with his strength and balance with this treatment. The patient is walking with a walker currently, he is now in physical therapy, he has improved slightly since discharge from the hospital. He has had MRI evaluation of the cervical, thoracic, and lumbar spine without any evidence of spinal cord injury. He does have a cervical disc at the C5-6 level contacting the cord, and at the T7 level, again without evidence of spinal cord injury. The patient reports no viral illnesses within the several weeks prior to admission. He reports no fevers, chills, skin rash.  No past  medical history on file.  Past Surgical History  Procedure Laterality Date  . Epididymis surgery  1987    Family History  Problem Relation Age of Onset  . Cancer Maternal Grandfather     Social history:  reports that he has never smoked. He does not have any smokeless tobacco history on file. He reports that he does not drink alcohol or use illicit drugs.  Medications:  Prior to Admission medications   Medication Sig Start Date End Date Taking? Authorizing Provider  magnesium oxide (MAG-OX) 400 MG tablet Take 400 mg by mouth daily.    Historical Provider, MD     No Known Allergies  ROS:  Out of a complete 14 system review of symptoms, the patient complains only of the following symptoms, and all other reviewed systems are negative.  Weight gain Constipation Numbness, weakness  Blood pressure 142/82, pulse 62, height  (1.702 m), weight 196 lb 4 oz (89.018 kg).  Physical Exam  General: The patient is alert and cooperative at the time of the examination.  Eyes: Pupils are equal, round, and reactive to light. Discs are flat bilaterally.  Neck: The neck is supple, no carotid bruits are noted.  Respiratory: The respiratory examination is clear.  Cardiovascular: The cardiovascular examination reveals a regular rate and rhythm, no obvious murmurs or rubs are noted.  Skin: Extremities are without significant edema.  Neurologic Exam  Mental status: The patient is alert and oriented x 3 at the time of the examination. The patient has apparent normal recent and remote memory, with an apparently normal attention span and  concentration ability.  Cranial nerves: Facial symmetry is present. There is good sensation of the face to pinprick and soft touch bilaterally. The strength of the facial muscles and the muscles to head turning and shoulder shrug are normal bilaterally. Speech is well enunciated, no aphasia or dysarthria is noted. Extraocular movements are full. Visual  fields are full. The tongue is midline, and the patient has symmetric elevation of the soft palate. No obvious hearing deficits are noted.  Motor: The motor testing reveals 5 over 5 strength of all 4 extremities, with exception of 4/5 strength with hip flexion on the right. Good symmetric motor tone is noted throughout.  Sensory: Sensory testing is intact to pinprick, soft touch, vibration sensation, and position sense on the upper extremities. With the lower extremities, the patient reports heightened pinprick, and vibration sensation on the right leg, position sensation is decreased on the right foot. No evidence of extinction is noted.  Coordination: Cerebellar testing reveals good finger-nose-finger and heel-to-shin bilaterally.  Gait and station: Gait is slightly wide-based, slightly unsteady. The patient walks much better with the walker. Tandem gait is slightly unsteady. Romberg is negative. No drift is seen.  Reflexes: Deep tendon reflexes are symmetric and normal bilaterally. Toes are downgoing bilaterally.   MRI cervical and thoracic 08/19/15:   IMPRESSION: 1. No significant abnormality of the lumbar spine. Slight degenerative disc disease at L4-5. 2. Small disc protrusion at T6-7 to the left of midline slightly indenting the ventral aspect of the spinal cord without myelopathy. 3. 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 Minimal compression of the spinal cord at C5-6 without myelopathy.   * MRI scan images were reviewed online. I agree with the written report.   Assessment/Plan:  1. Transverse myelitis, T4 level  The patient has no spinal cord lesion that correlates with the transverse myelitis. Spinal fluid was unremarkable with exception of slightly elevated protein level. The patient will be sent for further blood work today, and he will have MRI of the brain to fully exclude demyelinating disease such as multiple sclerosis. The negative oligoclonal banding makes  to this diagnosis much less likely, however. The patient will follow-up in 3 months, he will continue physical therapy. A note was given to keep the patient out of work for at least 3 months. He will be applying for short-term disability.  Marlan Palau. Keith Willis MD 09/02/2015 8:08 PM  [facility] [address] [place], [place] [id number]  Phone [phone] Fax [phone]

## 2015-09-03 DIAGNOSIS — Z0289 Encounter for other administrative examinations: Secondary | ICD-10-CM

## 2015-09-04 LAB — COPPER, SERUM: COPPER: 96 ug/dL (ref 72–166)

## 2015-09-04 LAB — ANGIOTENSIN CONVERTING ENZYME: Angio Convert Enzyme: 42 U/L (ref 14–82)

## 2015-09-04 LAB — VITAMIN B12: VITAMIN B 12: 490 pg/mL (ref 211–946)

## 2015-09-04 LAB — ANA W/REFLEX: Anti Nuclear Antibody(ANA): NEGATIVE

## 2015-09-04 LAB — RPR: RPR: NONREACTIVE

## 2015-09-04 LAB — RHEUMATOID FACTOR: Rhuematoid fact SerPl-aCnc: <10 IU/mL (ref 0.0–13.9)

## 2015-09-04 LAB — HIV ANTIBODY (ROUTINE TESTING W REFLEX): HIV Screen 4th Generation wRfx: NONREACTIVE

## 2015-09-04 LAB — HTLV I+II ANTIBODIES, (EIA), BLD: HTLV I/II AB: NEGATIVE

## 2015-09-04 LAB — SEDIMENTATION RATE: Sed Rate: 2 mm/hr (ref 0–15)

## 2015-09-07 ENCOUNTER — Telehealth: Payer: Self-pay

## 2015-09-07 NOTE — Telephone Encounter (Signed)
FMLA paperwork completed. Faxed to [employer] [facility] [address] [address] [phone]. Copy placed up front for pt pick-up. Unable to notify pt at this time d/t office phones being down.

## 2015-09-23 ENCOUNTER — Telehealth: Payer: Self-pay | Admitting: Neurology

## 2015-09-23 NOTE — Telephone Encounter (Signed)
Patient is calling because the numbness in his legs has gotten worse within the last week and thinks he needs to be seen soon. Please call and advise.

## 2015-09-23 NOTE — Telephone Encounter (Signed)
Returned pt TC. He reports increased numbness/tingling from mid-thigh down both legs over the past week. York SpanielSaid that someone did contact him to schedule MRI. However, he is unable to afford test at this time. Work-in appt scheduled for 12:00 tomorrow. Pt agreed to arrive 15 min prior to appt time.

## 2015-09-24 ENCOUNTER — Encounter: Payer: Self-pay | Admitting: Neurology

## 2015-09-24 ENCOUNTER — Ambulatory Visit (INDEPENDENT_AMBULATORY_CARE_PROVIDER_SITE_OTHER): Payer: Commercial Managed Care - PPO | Admitting: Neurology

## 2015-09-24 VITALS — BP 150/80 | HR 74 | Ht 67.0 in | Wt 195.0 lb

## 2015-09-24 DIAGNOSIS — G373 Acute transverse myelitis in demyelinating disease of central nervous system: Secondary | ICD-10-CM | POA: Diagnosis not present

## 2015-09-24 NOTE — Progress Notes (Signed)
Reason for visit: Transverse myelitis  Kenneth Garza is an 40 y.o. male  History of present illness:  Kenneth Garza is a 40 year old right-handed white male with a history of transverse myelitis with a T4 sensory level. The patient has not fully recovered, he still has some residual numbness of the legs, some balance issues. He feels tight in the back and the legs. Within the last week, he has had an increase in the numbness and tingling sensations from the mid thigh level down, not on the body. He denies any change in bowel or bladder function, his bowels are improving over time, he requires medications for constipation less often. He denies any change in leg strength or balance, he believes that this is actually improving over time. The patient has not had any issues with the face, or arms. The patient returns to the office today for an evaluation. The tingling sensations in the legs sometimes makes it difficult for him to sleep at night. We have ordered MRI of the brain, the patient did not wish to have a study done as he feels that he cannot afford the study this time. He has a high deductible with his insurance.  No past medical history on file.  Past Surgical History  Procedure Laterality Date  . Epididymis surgery  1987    Family History  Problem Relation Age of Onset  . Cancer Maternal Grandfather     Social history:  reports that he has never smoked. He does not have any smokeless tobacco history on file. He reports that he does not drink alcohol or use illicit drugs.   No Known Allergies  Medications:  Prior to Admission medications   Medication Sig Start Date End Date Taking? Authorizing Provider  magnesium oxide (MAG-OX) 400 MG tablet Take 400 mg by mouth daily.    Historical Provider, MD    ROS:  Out of a complete 14 system review of symptoms, the patient complains only of the following symptoms, and all other reviewed systems are negative.  Numbness in the  legs Gait disturbance  Blood pressure 150/80, pulse 74, height  (1.702 m), weight 195 lb (88.451 kg).  Physical Exam  General: The patient is alert and cooperative at the time of the examination.  Skin: No significant peripheral edema is noted.   Neurologic Exam  Mental status: The patient is alert and oriented x 3 at the time of the examination. The patient has apparent normal recent and remote memory, with an apparently normal attention span and concentration ability.   Cranial nerves: Facial symmetry is present. Speech is normal, no aphasia or dysarthria is noted. Extraocular movements are full. Visual fields are full.  Motor: The patient has good strength in all 4 extremities.  Sensory examination: Soft touch sensation is symmetric on the face, arms, and legs. The patient reports tingling with soft touch on the legs.  Coordination: The patient has good finger-nose-finger and heel-to-shin bilaterally.  Gait and station: The patient has a slightly wide-based gait. The patient can walk independently. Tandem gait is slightly unsteady. Romberg is negative. The patient is able to walk on heels and the toes.  Reflexes: Deep tendon reflexes are symmetric in arms, right knee jerk reflexes somewhat more prominent than the left.   Assessment/Plan:  1. Transverse myelitis  2. Gait disturbance  The patient reports increasing sensations of numbness in the legs from the mid thigh level down. This began about a week ago, and has not resulted in  any functional deterioration. At this point, we will follow the patient conservatively. If the patient begins to have changes in gait, or changes in bowel or bladder function, we will get him in for IV Solu-Medrol and a prednisone taper. The patient will need to have MRI of the brain at some point, he will call me when he wishes to have the study done. He will follow-up for his next scheduled appointment in August. The patient does not wish to go  on medication such as gabapentin for the dysesthesias.  Marlan Palau. Keith Willis MD 09/24/2015 6:15 PM  [facility] [address] [place], [place] [address]  Phone [phone] Fax [phone]

## 2015-09-28 ENCOUNTER — Telehealth: Payer: Self-pay | Admitting: Neurology

## 2015-09-28 NOTE — Telephone Encounter (Signed)
Alex with [employer] called requesting restrictions and limitations for pts Disability. Please call [phone]

## 2015-09-28 NOTE — Telephone Encounter (Addendum)
Returned call to Trinna Postlex - she will be faxing over paperwork to be addressed by Dr. Anne HahnWillis.  I let her know that he will be out of the office until 10/05/15.

## 2015-10-12 ENCOUNTER — Telehealth: Payer: Self-pay | Admitting: *Deleted

## 2015-10-12 NOTE — Telephone Encounter (Signed)
Pt [employer] life form on [place] desk.

## 2015-10-13 NOTE — Telephone Encounter (Signed)
Paperwork started, given to Dr. Anne HahnWillis for review/completion/signature. See previous phone note.

## 2015-10-13 NOTE — Telephone Encounter (Signed)
Received fax and initiated paperwork from [employer]. Given to Dr. Anne HahnWillis for review/completion/signature.

## 2015-10-13 NOTE — Telephone Encounter (Signed)
The disability form has been filled out.

## 2015-10-19 ENCOUNTER — Telehealth: Payer: Self-pay | Admitting: *Deleted

## 2015-10-19 NOTE — Telephone Encounter (Signed)
Pt form faxed to [employer] on 11/15/2015.

## 2015-10-21 DIAGNOSIS — Z0289 Encounter for other administrative examinations: Secondary | ICD-10-CM

## 2015-11-06 DIAGNOSIS — Z0289 Encounter for other administrative examinations: Secondary | ICD-10-CM

## 2015-11-18 ENCOUNTER — Telehealth: Payer: Self-pay | Admitting: Neurology

## 2015-11-18 NOTE — Telephone Encounter (Signed)
Patient is calling to discuss what his payment would be for an MRI.

## 2015-11-25 NOTE — Telephone Encounter (Signed)
Patient is requesting to know if there have been any updates to his benefits. I told him I would check benefits and call him back.

## 2015-12-03 ENCOUNTER — Encounter: Payer: Self-pay | Admitting: Neurology

## 2015-12-03 ENCOUNTER — Ambulatory Visit (INDEPENDENT_AMBULATORY_CARE_PROVIDER_SITE_OTHER): Payer: Commercial Managed Care - PPO | Admitting: Neurology

## 2015-12-03 VITALS — BP 139/92 | HR 65 | Ht 67.0 in | Wt 215.2 lb

## 2015-12-03 DIAGNOSIS — G373 Acute transverse myelitis in demyelinating disease of central nervous system: Secondary | ICD-10-CM | POA: Diagnosis not present

## 2015-12-03 MED ORDER — BACLOFEN 10 MG PO TABS: 5.0000 mg | ORAL_TABLET | Freq: Two times a day (BID) | ORAL | 1 refills | Status: DC

## 2015-12-03 NOTE — Progress Notes (Signed)
Reason for visit: Transverse myelitis  Kenneth Garza is an 40 y.o. male  History of present illness:  Kenneth Garza is a 40 year old right-handed white male with a history of a transverse myelitis associated with right greater than left lower extremity weakness. The patient has paresthesias involving the feet, right greater than left, and some decreased temperature sensation on the left leg greater than the right. The patient indicates that his bowels and bladder are working better, he still has a footdrop on the right, he has undergone some physical therapy for this. AFO bracing of the right foot was considered. The patient has a tight sensation in the legs, he is sleeping fairly well at night however. The patient denies any overt cramps. The patient has full use of the arms. He has had not yet undergone MRI evaluation of the brain. Spinal fluid analysis revealed negative oligoclonal banding. He remains out of work, his short-term disability has lapsed. He returns for an evaluation. With walking, he has some fatigue, he has a tendency to catch the right toe at times.  No past medical history on file.  Past Surgical History:  Procedure Laterality Date  . EPIDIDYMIS SURGERY  1987    Family History  Problem Relation Age of Onset  . Cancer Maternal Grandfather     Social history:  reports that he has never smoked. He does not have any smokeless tobacco history on file. He reports that he does not drink alcohol or use drugs.   No Known Allergies  Medications:  Prior to Admission medications   Medication Sig Start Date End Date Taking? Authorizing Provider  magnesium oxide (MAG-OX) 400 MG tablet Take 400 mg by mouth daily.   Yes Historical Provider, MD    ROS:  Out of a complete 14 system review of symptoms, the patient complains only of the following symptoms, and all other reviewed systems are negative.  Numbness, weakness  Blood pressure (!) 139/92, pulse 65, height 5\' 7"   (1.702 m), weight 215 lb 4 oz (97.6 kg).  Physical Exam  General: The patient is alert and cooperative at the time of the examination.  Skin: No significant peripheral edema is noted.   Neurologic Exam  Mental status: The patient is alert and oriented x 3 at the time of the examination. The patient has apparent normal recent and remote memory, with an apparently normal attention span and concentration ability.   Cranial nerves: Facial symmetry is present. Speech is normal, no aphasia or dysarthria is noted. Extraocular movements are full. Visual fields are full.  Motor: The patient has good strength in all 4 extremities, with exception of a footdrop on the right, 4/5 strength, 4/5 strength with the hip flexion on the right, trace weakness with knee extension and flexion on the right, good flexion and extension strength with the left knee and with dorsiflexion of the left foot.  Sensory examination: Soft touch sensation is decreased on the right leg relative to the left, symmetric on the arms and face.  Coordination: The patient has good finger-nose-finger and heel-to-shin bilaterally.  Gait and station: The patient has a slightly wide-based gait, mild steppage pattern gait on the right leg. Tandem gait is slightly unsteady. Romberg is negative.  Reflexes: Deep tendon reflexes are symmetric, but are brisk in the lower extremities.   Assessment/Plan:  1. Transverse myelitis  2. Gait disturbance  The patient is trying to get back to driving a car, he is able to feel the right foot a  little bit better. 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 The patient is heat sensitive to some degree. He is reporting a lot of stiffness in the legs, we will start low-dose baclofen. He believes that his employer may be able to modify his job. We will keep him out of work for another 2 weeks, he may be able to return to work in 2 weeks  part-time if he can work and a more sedentary job does not require heavy lifting or balancing. He will follow-up in 3 months.   Marlan Palau. Keith Detria Cummings MD 12/03/2015 7:32 AM  [facility] [address] [place], [place] [address]  Phone [phone] Fax [phone]

## 2015-12-08 ENCOUNTER — Telehealth: Payer: Self-pay | Admitting: Neurology

## 2015-12-08 DIAGNOSIS — M21371 Foot drop, right foot: Secondary | ICD-10-CM

## 2015-12-08 NOTE — Telephone Encounter (Signed)
Patient returned Danielle's call °

## 2015-12-08 NOTE — Telephone Encounter (Signed)
Mailed AFO order from Dr Anne HahnWillis to pt.

## 2015-12-08 NOTE — Telephone Encounter (Signed)
I called patient and left him a VM asking him to call me.

## 2015-12-08 NOTE — Telephone Encounter (Signed)
Patient called to request prescription for AFO Brace and prescription/order for Physical Therapy "if Dr. Anne HahnWillis wants him to continue". Please call after 12pm [phone].

## 2015-12-08 NOTE — Telephone Encounter (Signed)
Message sent to Dr. Anne HahnWillis about Pt and brace order.

## 2015-12-08 NOTE — Telephone Encounter (Signed)
I called patient. The patient does wish to consider an AFO brace, physical therapy agrees with this, I will send the prescription to the patient.

## 2015-12-11 NOTE — Telephone Encounter (Signed)
Called patient and gave him benefits and scheduled his MRI.

## 2015-12-16 ENCOUNTER — Ambulatory Visit (INDEPENDENT_AMBULATORY_CARE_PROVIDER_SITE_OTHER): Payer: Commercial Managed Care - PPO

## 2015-12-16 DIAGNOSIS — G373 Acute transverse myelitis in demyelinating disease of central nervous system: Secondary | ICD-10-CM | POA: Diagnosis not present

## 2015-12-17 MED ORDER — GADOPENTETATE DIMEGLUMINE 469.01 MG/ML IV SOLN: 20.0000 mL | Freq: Once | INTRAVENOUS | Status: DC | PRN

## 2015-12-18 ENCOUNTER — Telehealth: Payer: Self-pay | Admitting: Neurology

## 2015-12-18 NOTE — Telephone Encounter (Signed)
  I called the patient. The MRI of the brain is normal. No evidence of MS.  MRI brain 12/17/15:  IMPRESSION:  Normal MRI brain (with and without).

## 2015-12-31 ENCOUNTER — Telehealth: Payer: Self-pay | Admitting: Neurology

## 2015-12-31 ENCOUNTER — Encounter: Payer: Self-pay | Admitting: Neurology

## 2015-12-31 MED ORDER — BACLOFEN 10 MG PO TABS: 10.0000 mg | ORAL_TABLET | Freq: Two times a day (BID) | ORAL | 2 refills | Status: DC

## 2015-12-31 NOTE — Telephone Encounter (Signed)
I called the patient. He needs a note to return to work, we will get him back to work part-time for least 2 weeks to see how he does. I'll increase the baclofen taking 10 mg twice daily.

## 2015-12-31 NOTE — Telephone Encounter (Signed)
Pt called requesting a letter stating he can return to work starting Monday, Sept 18 th. Pt also requesting increase in dosage on muscle relaxer.  Please call and advise [phone]

## 2016-01-01 NOTE — Telephone Encounter (Signed)
Called pt to let him know that letter was completed, signed and up front for pick-up. May call back if he would like a copy mailed to him, as well.

## 2016-01-01 NOTE — Telephone Encounter (Signed)
Patient called to request letter be faxed to his work Attn: [employer], [employer], states we have fax number on file.

## 2016-01-04 ENCOUNTER — Encounter: Payer: Self-pay | Admitting: Neurology

## 2016-01-04 NOTE — Telephone Encounter (Signed)
Pt called said the letter reads to start part time today but he started part time 12/22/15. He should be full time as of today. Pls call

## 2016-01-04 NOTE — Telephone Encounter (Signed)
Pt called in to have work letter faxed in . I asked for him to give me fax number. He stated he will have to call back with the number.

## 2016-01-04 NOTE — Telephone Encounter (Signed)
Letter faxed to pt's employer as requested. Copy mailed to pt's address. Pt notified via TC.

## 2016-01-04 NOTE — Telephone Encounter (Signed)
I called patient. I will redo the letter to allow him to start full-time as of today's date.

## 2016-01-04 NOTE — Telephone Encounter (Signed)
[facility] [employer]  Fax: [phone] Phone: [phone]

## 2016-01-20 DIAGNOSIS — Z0271 Encounter for disability determination: Secondary | ICD-10-CM

## 2016-03-07 ENCOUNTER — Ambulatory Visit (INDEPENDENT_AMBULATORY_CARE_PROVIDER_SITE_OTHER): Payer: Commercial Managed Care - PPO | Admitting: Neurology

## 2016-03-07 ENCOUNTER — Encounter: Payer: Self-pay | Admitting: Neurology

## 2016-03-07 VITALS — BP 166/94 | HR 75 | Ht 67.0 in | Wt 217.8 lb

## 2016-03-07 DIAGNOSIS — N319 Neuromuscular dysfunction of bladder, unspecified: Secondary | ICD-10-CM | POA: Diagnosis not present

## 2016-03-07 DIAGNOSIS — G373 Acute transverse myelitis in demyelinating disease of central nervous system: Secondary | ICD-10-CM

## 2016-03-07 HISTORY — DX: Neuromuscular dysfunction of bladder, unspecified: N31.9

## 2016-03-07 MED ORDER — BACLOFEN 10 MG PO TABS: 10.0000 mg | ORAL_TABLET | Freq: Three times a day (TID) | ORAL | 2 refills | Status: DC

## 2016-03-07 NOTE — Progress Notes (Signed)
Reason for visit: Transverse myelitis  Kenneth Garza is an 40 y.o. male  History of present illness:  Kenneth Garza is a 40 year old right-handed white male with a history of transverse myelitis. The patient has spasticity and weakness that affects the right leg greater than the left. The sensation is decreased on the left leg more than the right. The patient has been using AFO brace on the right leg with good benefit. The patient is not had any falls. He does report some right hip pain at times. He has increased spasticity first thing in the morning, he takes baclofen 10 mg twice daily. He can feel the baclofen wearing off later in the day, he takes the next dose around 7 PM. The patient is working full-time, he is doing well with this. He does report some difficulty with urinary urgency, no incontinence. He has not had issues with constipation recently. He reports paresthesias in the legs, left greater than right, and this may prevent him from getting to sleep quickly. He returns for an evaluation.  Past Medical History:  Diagnosis Date  . Neurogenic bladder 03/07/2016    Past Surgical History:  Procedure Laterality Date  . EPIDIDYMIS SURGERY  1987    Family History  Problem Relation Age of Onset  . Cancer Maternal Grandfather     Social history:  reports that he has never smoked. He does not have any smokeless tobacco history on file. He reports that he does not drink alcohol or use drugs.   No Known Allergies  Medications:  Prior to Admission medications   Medication Sig Start Date End Date Taking? Authorizing Provider  baclofen (LIORESAL) 10 MG tablet Take 1 tablet (10 mg total) by mouth 3 (three) times daily. 03/07/16   York Spanielharles K Willis, MD  magnesium oxide (MAG-OX) 400 MG tablet Take 400 mg by mouth daily.    Historical Provider, MD    ROS:  Out of a complete 14 system review of symptoms, the patient complains only of the following symptoms, and all other reviewed  systems are negative.  Urinary urgency  Joint pain, back pain  Blood pressure (!) 166/94, pulse 75, height 5\' 7"  (1.702 m), weight 217 lb 12 oz (98.8 kg).  Physical Exam  General: The patient is alert and cooperative at the time of the examination.  Skin: No significant peripheral edema is noted.   Neurologic Exam  Mental status: The patient is alert and oriented x 3 at the time of the examination. The patient has apparent normal recent and remote memory, with an apparently normal attention span and concentration ability.   Cranial nerves: Facial symmetry is present. Speech is normal, no aphasia or dysarthria is noted. Extraocular movements are full. Visual fields are full.  Motor: The patient has good strength in all 4 extremities, with exception of a footdrop on the right.  Sensory examination: Soft touch sensation is symmetric on the face, and arms. Soft touch sensation is decreased on the left leg as compared to the right.   Coordination: The patient has good finger-nose-finger and heel-to-shin bilaterally.  Gait and station: The patient has a relatively normal gait. Tandem gait is minimally unsteady. Romberg is negative. No drift is seen.  Reflexes: Deep tendon reflexes are symmetric in the arms, increased knee jerk reflex on the right as compared to the left.   Assessment/Plan:  1. Transverse myelitis  The patient is doing fairly well at this time with the ability to ambulate. He has some difficulty  with spasticity, we will increase the baclofen to 10 mg 3 times daily. The patient has developed some issues with urgency of the bladder, and if this remains a problem, medication can be added. He is reporting some paresthesias in the legs, if this worsens over time, this may need to be treated as well. He will follow-up in 4 months.   Marlan Palau. Keith Willis MD 03/07/2016 7:47 AM  [facility] [address] [place], [place] [address]  Phone  [phone] Fax [phone]

## 2016-07-05 ENCOUNTER — Ambulatory Visit (INDEPENDENT_AMBULATORY_CARE_PROVIDER_SITE_OTHER): Payer: Commercial Managed Care - PPO | Admitting: Adult Health

## 2016-07-05 ENCOUNTER — Encounter: Payer: Self-pay | Admitting: Adult Health

## 2016-07-05 VITALS — BP 153/92 | HR 63 | Ht 67.0 in | Wt 212.0 lb

## 2016-07-05 DIAGNOSIS — G373 Acute transverse myelitis in demyelinating disease of central nervous system: Secondary | ICD-10-CM

## 2016-07-05 DIAGNOSIS — R252 Cramp and spasm: Secondary | ICD-10-CM | POA: Diagnosis not present

## 2016-07-05 MED ORDER — BACLOFEN 20 MG PO TABS: 20.0000 mg | ORAL_TABLET | Freq: Three times a day (TID) | ORAL | 5 refills | Status: DC

## 2016-07-05 NOTE — Progress Notes (Signed)
PATIENT: Kenneth Garza DOB: 10/09/1975  REASON FOR VISIT: follow up- transverse myelitis HISTORY FROM: patient  HISTORY OF PRESENT ILLNESS: Kenneth Garza is a 41 year old male with a history of transverse myelitis. He returns today for follow-up. He states that the increase in baclofen has helped his muscle spasms however he did increase to 4 times a day. He states that he typically takes the medication at 6 AM, 12 PM, 2 PM and before bedtime. He states that he notices the most stiffness after lunch time. He states that he continues to have paresthesias in the lower extremities however he denies any pain. He states that on the left leg he is unable to distinguish between hot and cold however he is able to do this on the right. Patient feels that his bladder urgency has improved. Denies any issues with the bowels. He states in the last 2-3 months he is not had to use a stool softener. He feels that he is walking better. Denies any falls. He continues to use an AFO brace on the right. He returns today for an evaluation.  HISTORY 03/07/16: Kenneth Garza is a 41 year old right-handed white male with a history of transverse myelitis. The patient has spasticity and weakness that affects the right leg greater than the left. The sensation is decreased on the left leg more than the right. The patient has been using AFO brace on the right leg with good benefit. The patient is not had any falls. He does report some right hip pain at times. He has increased spasticity first thing in the morning, he takes baclofen 10 mg twice daily. He can feel the baclofen wearing off later in the day, he takes the next dose around 7 PM. The patient is working full-time, he is doing well with this. He does report some difficulty with urinary urgency, no incontinence. He has not had issues with constipation recently. He reports paresthesias in the legs, left greater than right, and this may prevent him from getting to sleep  quickly. He returns for an evaluation.  REVIEW OF SYSTEMS: Out of a complete 14 system review of symptoms, the patient complains only of the following symptoms, and all other reviewed systems are negative.  Muscle cramps, restless leg  ALLERGIES: No Known Allergies  HOME MEDICATIONS: Outpatient Medications Prior to Visit  Medication Sig Dispense Refill  . magnesium oxide (MAG-OX) 400 MG tablet Take 400 mg by mouth daily.    . baclofen (LIORESAL) 10 MG tablet Take 1 tablet (10 mg total) by mouth 3 (three) times daily. (Patient taking differently: Take 10 mg by mouth 3 (three) times daily. Pt takes 4 times a day per himself) 270 each 2  . gadopentetate dimeglumine (MAGNEVIST) injection 20 mL      No facility-administered medications prior to visit.     PAST MEDICAL HISTORY: Past Medical History:  Diagnosis Date  . Neurogenic bladder 03/07/2016    PAST SURGICAL HISTORY: Past Surgical History:  Procedure Laterality Date  . EPIDIDYMIS SURGERY  1987    FAMILY HISTORY: Family History  Problem Relation Age of Onset  . Cancer Maternal Grandfather     SOCIAL HISTORY: Social History   Social History  . Marital status: Single    Spouse name: N/A  . Number of children: 1  . Years of education: 76   Occupational History  . [employer]    Social History Main Topics  . Smoking status: Current Every Day Smoker    Packs/day: 0.25  Types: Cigarettes  . Smokeless tobacco: Never Used  . Alcohol use No     Comment: Occasional  . Drug use: No     Comment: Occasional marijuana  . Sexual activity: Not on file   Other Topics Concern  . Not on file   Social History Narrative   Lives at home w/ girlfriend, Efraim KaufmannMelissa   Right-handed   Occasionally drinks 12 oz Pepsi      PHYSICAL EXAM  Vitals:   07/05/16 0718  BP: (!) 153/92  Pulse: 63  Weight: 212 lb (96.2 kg)  Height: 5\' 7"  (1.702 m)   Body mass index is 33.2 kg/m.  Generalized: Well developed, in no acute  distress   Neurological examination  Mentation: Alert oriented to time, place, history taking. Follows all commands speech and language fluent Cranial nerve II-XII: Pupils were equal round reactive to light. Extraocular movements were full, visual field were full on confrontational test. Facial sensation and strength were normal. Uvula tongue midline. Head turning and shoulder shrug  were normal and symmetric. Motor: The motor testing reveals 5 over 5 strength of all 4 extremities. Good symmetric motor tone is noted throughout.  Sensory: Sensory testing is intact to soft touch on all 4 extremities. No evidence of extinction is noted.  Coordination: Cerebellar testing reveals good finger-nose-finger and heel-to-shin bilaterally.  Gait and station: Gait is normal. Tandem gait is normal. Romberg is negative. No drift is seen.  Reflexes: Deep tendon reflexes are symmetric and normal bilaterally.   DIAGNOSTIC DATA (LABS, IMAGING, TESTING) - I reviewed patient records, labs, notes, testing and imaging myself where available.  Lab Results  Component Value Date   WBC 5.4 08/19/2015   HGB 14.6 08/19/2015   HCT 42.6 08/19/2015   MCV 90.8 08/19/2015   PLT 230 08/19/2015      Component Value Date/Time   NA 142 08/19/2015 1056   K 4.0 08/19/2015 1056   CL 111 08/19/2015 1056   CO2 22 08/19/2015 1056   GLUCOSE 101 (H) 08/19/2015 1056   BUN 8 08/19/2015 1056   CREATININE 0.87 08/19/2015 1056   CALCIUM 9.3 08/19/2015 1056   PROT 6.7 08/19/2015 1056   ALBUMIN 4.1 08/19/2015 1056   AST 17 08/19/2015 1056   ALT 16 (L) 08/19/2015 1056   ALKPHOS 46 08/19/2015 1056   BILITOT 0.8 08/19/2015 1056   GFRNONAA >60 08/19/2015 1056   GFRAA >60 08/19/2015 1056      ASSESSMENT AND PLAN 41 y.o. year old male  has a past medical history of Neurogenic bladder (03/07/2016). here with :  1. Transverse melitis 2. Spasticity  The patient continues to have issues with spasticity. We will increase baclofen  to 20 mg 3 times a day. Patient advised that if this is not beneficial he should let us know. Advised that increase in baclofen can cause drowsiness. He verbalized understanding. I provided the patient with a handout reviewing baclofen. He is advised that if his symptoms worsen or he develops new symptoms he should let us know. He will follow-up in 6 months or sooner if needed.    Butch PennyMegan Teryl Gubler, MSN, NP-C 07/05/2016, 7:55 AM [facility] [address], [address] [place], [place] [id number] [phone]

## 2016-07-05 NOTE — Progress Notes (Signed)
I have read the note, and I agree with the clinical assessment and plan.  Jaisa Defino KEITH   

## 2016-07-05 NOTE — Patient Instructions (Signed)
Increase baclofen to 20 mg three times a day If your symptoms worsen or you develop new symptoms please let us know.   Baclofen tablets What is this medicine? BACLOFEN (BAK loe fen) helps relieve spasms and cramping of muscles. It may be used to treat symptoms of multiple sclerosis or spinal cord injury. This medicine may be used for other purposes; ask your health care provider or pharmacist if you have questions. COMMON BRAND NAME(S): ED Baclofen, Lioresal What should I tell my health care provider before I take this medicine? They need to know if you have any of these conditions: -kidney disease -seizures -stroke -an unusual or allergic reaction to baclofen, other medicines, foods, dyes, or preservatives -pregnant or trying to get pregnant -breast-feeding How should I use this medicine? Take this medicine by mouth. Swallow it with a drink of water. Follow the directions on the prescription label. Do not take more medicine than you are told to take. Talk to your pediatrician regarding the use of this medicine in children. Special care may be needed. Overdosage: If you think you have taken too much of this medicine contact a poison control center or emergency room at once. NOTE: This medicine is only for you. Do not share this medicine with others. What if I miss a dose? If you miss a dose, take it as soon as you can. If it is almost time for your next dose, take only that dose. Do not take double or extra doses. What may interact with this medicine? Do not take this medication with any of the following medicines: -narcotic medicines for cough This medicine may also interact with the following medications: -alcohol -antihistamines for allergy, cough and cold -certain medicines for anxiety or sleep -certain medicines for depression like amitriptyline, fluoxetine, sertraline -certain medicines for seizures like phenobarbital, primidone -general anesthetics like halothane, isoflurane,  methoxyflurane, propofol -local anesthetics like lidocaine, pramoxine, tetracaine -medicines that relax muscles for surgery -narcotic medicines for pain -phenothiazines like chlorpromazine, mesoridazine, prochlorperazine, thioridazine This list may not describe all possible interactions. Give your health care provider a list of all the medicines, herbs, non-prescription drugs, or dietary supplements you use. Also tell them if you smoke, drink alcohol, or use illegal drugs. Some items may interact with your medicine. What should I watch for while using this medicine? Tell your doctor or health care professional if your symptoms do not start to get better or if they get worse. Do not suddenly stop taking your medicine. If you do, you may develop a severe reaction. If your doctor wants you to stop the medicine, the dose will be slowly lowered over time to avoid any side effects. Follow the advice of your doctor. You may get drowsy or dizzy. Do not drive, use machinery, or do anything that needs mental alertness until you know how this medicine affects you. Do not stand or sit up quickly, especially if you are an older patient. This reduces the risk of dizzy or fainting spells. Alcohol may interfere with the effect of this medicine. Avoid alcoholic drinks. If you are taking another medicine that also causes drowsiness, you may have more side effects. Give your health care provider a list of all medicines you use. Your doctor will tell you how much medicine to take. Do not take more medicine than directed. Call emergency for help if you have problems breathing or unusual sleepiness. What side effects may I notice from receiving this medicine? Side effects that you should report to your doctor  or health care professional as soon as possible: -allergic reactions like skin rash, itching or hives, swelling of the face, lips, or tongue -breathing problems -changes in emotions or moods -changes in  vision -chest pain -fast, irregular heartbeat -feeling faint or lightheaded, falls -hallucinations -loss of balance or coordination -ringing of the ears -seizures -trouble passing urine or change in the amount of urine -trouble walking -unusually weak or tired Side effects that usually do not require medical attention (report to your doctor or health care professional if they continue or are bothersome): -changes in taste -confusion -constipation -diarrhea -dry mouth -headache -muscle weakness -nausea, vomiting -trouble sleeping This list may not describe all possible side effects. Call your doctor for medical advice about side effects. You may report side effects to FDA at 1-800-FDA-1088. Where should I keep my medicine? Keep out of the reach of children. Store at room temperature between 15 and 30 degrees C (59 and 86 degrees F). Keep container tightly closed. Throw away any unused medicine after the expiration date. NOTE: This sheet is a summary. It may not cover all possible information. If you have questions about this medicine, talk to your doctor, pharmacist, or health care provider.  2018 Elsevier/Gold Standard (2015-01-12 15:56:23)

## 2016-11-03 DIAGNOSIS — L0591 Pilonidal cyst without abscess: Secondary | ICD-10-CM | POA: Insufficient documentation

## 2017-01-05 ENCOUNTER — Telehealth: Payer: Self-pay | Admitting: Neurology

## 2017-01-05 ENCOUNTER — Ambulatory Visit: Payer: Commercial Managed Care - PPO | Admitting: Adult Health

## 2017-01-05 NOTE — Telephone Encounter (Signed)
Called patient this morning in regards to his apt scheduled for 8:30 am. I called twice and was no answer. LVM to make pt aware that the provider had a family emergency and was not going to be here this am. I attempted to call his girlfriend listed on the DPR and no answer. I called the patient once more after that just to try and reach out. The calls were made between 7:15 and 7:35 am. No answer.

## 2017-01-09 ENCOUNTER — Other Ambulatory Visit: Payer: Self-pay | Admitting: Adult Health

## 2017-03-30 ENCOUNTER — Ambulatory Visit: Payer: Commercial Managed Care - PPO | Admitting: Adult Health

## 2017-07-08 ENCOUNTER — Other Ambulatory Visit: Payer: Self-pay | Admitting: Adult Health

## 2017-07-12 ENCOUNTER — Encounter: Payer: Self-pay | Admitting: Adult Health

## 2017-07-12 ENCOUNTER — Ambulatory Visit: Payer: 59 | Admitting: Adult Health

## 2017-07-12 VITALS — BP 130/77 | HR 60 | Ht 66.0 in | Wt 192.6 lb

## 2017-07-12 DIAGNOSIS — G373 Acute transverse myelitis in demyelinating disease of central nervous system: Secondary | ICD-10-CM

## 2017-07-12 MED ORDER — BACLOFEN 20 MG PO TABS: ORAL_TABLET | ORAL | 5 refills | Status: DC

## 2017-07-12 NOTE — Progress Notes (Signed)
I have read the note, and I agree with the clinical assessment and plan.  Charles K Willis   

## 2017-07-12 NOTE — Progress Notes (Signed)
PATIENT: Kenneth Garza DOB: 1975-09-02  REASON FOR VISIT: follow up HISTORY FROM: patient  HISTORY OF PRESENT ILLNESS: Today 07/12/17: Kenneth Garza is a 42 year old male with a history of transverse myelitis.  He returns today for follow-up.  He reports that he is doing well.  At the last visit baclofen was increased to 20 mg 3 times a day.  He reports this is been an official however he states the effect of baclofen wears off between doses.  He is not interested in a baclofen pump.  He denies any significant changes with his gait.  Reports that he can still no longer feel hot and cold on the left leg.  Reports that he is no longer having to take laxatives.  Denies any significant changes with bowels or bladder.  He reports that he has had some pain in the left foot.  He states that that has been present before.  He contributes this to long hours at work.  He reports that he does not wear the AFO brace.  He feels that this made his foot drop worse when he removed the brace.  He returns today for evaluation.  HISTORY 12/03/15:Kenneth Garza is a 42 year old right-handed white male with a history of a transverse myelitis associated with right greater than left lower extremity weakness. The patient has paresthesias involving the feet, right greater than left, and some decreased temperature sensation on the left leg greater than the right. The patient indicates that his bowels and bladder are working better, he still has a footdrop on the right, he has undergone some physical therapy for this. AFO bracing of the right foot was considered. The patient has a tight sensation in the legs, he is sleeping fairly well at night however. The patient denies any overt cramps. The patient has full use of the arms. He has had not yet undergone MRI evaluation of the brain. Spinal fluid analysis revealed negative oligoclonal banding. He remains out of work, his short-term disability has lapsed. He returns for an  evaluation. With walking, he has some fatigue, he has a tendency to catch the right toe at times.   REVIEW OF SYSTEMS: Out of a complete 14 system review of symptoms, the patient complains only of the following symptoms, and all other reviewed systems are negative.  See HPI  ALLERGIES: No Known Allergies  HOME MEDICATIONS: Outpatient Medications Prior to Visit  Medication Sig Dispense Refill  . magnesium oxide (MAG-OX) 400 MG tablet Take 400 mg by mouth daily.    . baclofen (LIORESAL) 20 MG tablet TAKE 1 TABLET BY MOUTH THREE TIMES DAILY 90 tablet 5   No facility-administered medications prior to visit.     PAST MEDICAL HISTORY: Past Medical History:  Diagnosis Date  . Neurogenic bladder 03/07/2016    PAST SURGICAL HISTORY: Past Surgical History:  Procedure Laterality Date  . EPIDIDYMIS SURGERY  1987    FAMILY HISTORY: Family History  Problem Relation Age of Onset  . Cancer Maternal Grandfather     SOCIAL HISTORY: Social History   Socioeconomic History  . Marital status: Single    Spouse name: Not on file  . Number of children: 1  . Years of education: 6012  . Highest education level: Not on file  Occupational History  . Occupation: [employer]  Social Needs  . Financial resource strain: Not on file  . Food insecurity:    Worry: Not on file    Inability: Not on file  . Transportation needs:  Medical: Not on file    Non-medical: Not on file  Tobacco Use  . Smoking status: Current Every Day Smoker    Packs/day: 0.25    Types: Cigarettes  . Smokeless tobacco: Never Used  Substance and Sexual Activity  . Alcohol use: No    Alcohol/week: 0.0 oz    Comment: Occasional  . Drug use: No    Comment: Occasional marijuana  . Sexual activity: Not on file  Lifestyle  . Physical activity:    Days per week: Not on file    Minutes per session: Not on file  . Stress: Not on file  Relationships  . Social connections:    Talks on phone: Not on file    Gets  together: Not on file    Attends religious service: Not on file    Active member of club or organization: Not on file    Attends meetings of clubs or organizations: Not on file    Relationship status: Not on file  . Intimate partner violence:    Fear of current or ex partner: Not on file    Emotionally abused: Not on file    Physically abused: Not on file    Forced sexual activity: Not on file  Other Topics Concern  . Not on file  Social History Narrative   Lives at home w/ girlfriend, Efraim Kaufmann   Right-handed   Occasionally drinks 12 oz Pepsi      PHYSICAL EXAM  Vitals:   07/12/17 1259  BP: 130/77  Pulse: 60  Weight: 192 lb 9.6 oz (87.4 kg)  Height: 5\' 6"  (1.676 m)   Body mass index is 31.09 kg/m.  Generalized: Well developed, in no acute distress   Neurological examination  Mentation: Alert oriented to time, place, history taking. Follows all commands speech and language fluent Cranial nerve II-XII: Pupils were equal round reactive to light. Extraocular movements were full, visual field were full on confrontational test. Facial sensation and strength were normal. Uvula tongue midline. Head turning and shoulder shrug  were normal and symmetric. Motor: The motor testing reveals 5 over 5 strength of all 4 extremities. Sensory: Sensory testing is intact to soft touch on all 4 extremities. No evidence of extinction is noted.  Coordination: Cerebellar testing reveals good finger-nose-finger and heel-to-shin bilaterally.  Gait and station: Gait is normal. Tandem gait is normal. Romberg is negative. No drift is seen.  Reflexes: Deep tendon reflexes are symmetric and normal bilaterally.   DIAGNOSTIC DATA (LABS, IMAGING, TESTING) - I reviewed patient records, labs, notes, testing and imaging myself where available.  Lab Results  Component Value Date   WBC 5.4 08/19/2015   HGB 14.6 08/19/2015   HCT 42.6 08/19/2015   MCV 90.8 08/19/2015   PLT 230 08/19/2015      Component  Value Date/Time   NA 142 08/19/2015 1056   K 4.0 08/19/2015 1056   CL 111 08/19/2015 1056   CO2 22 08/19/2015 1056   GLUCOSE 101 (H) 08/19/2015 1056   BUN 8 08/19/2015 1056   CREATININE 0.87 08/19/2015 1056   CALCIUM 9.3 08/19/2015 1056   PROT 6.7 08/19/2015 1056   ALBUMIN 4.1 08/19/2015 1056   AST 17 08/19/2015 1056   ALT 16 (L) 08/19/2015 1056   ALKPHOS 46 08/19/2015 1056   BILITOT 0.8 08/19/2015 1056   GFRNONAA >60 08/19/2015 1056   GFRAA >60 08/19/2015 1056      ASSESSMENT AND PLAN 42 y.o. year old male  has a past medical  history of Neurogenic bladder (03/07/2016). here with:  1.  Transverse myelitis  Overall the patient is doing well.  We will increase baclofen to 80 mg daily.  He will take 20 mg at 7 AM, 2 PM and 9 PM.  He will take a half a tablet (10 mg) at 11 AM and 4 PM.  If he is unable to tolerate the increase in baclofen he will let us know.  I again reviewed the side effects of baclofen with the patient.  We also discussed a baclofen pump but the patient deferred.   if his symptoms worsen or he develops new symptoms he should let us know.  He will follow-up in 8 months or sooner if needed.  Discussing his medication  I spent 15 minutes with the patient. 50% of this time was spent discussing his medication baclofen   Butch Penny, MSN, NP-C 07/12/2017, 1:29 PM [facility] [address], [address] [place], [place] [address] [phone]

## 2017-07-12 NOTE — Patient Instructions (Signed)
Your Plan:  Increase baclofen take 20 mg 7 am, 2 pm and 9pm. Can take 10 mg (1/2 tablet at 11 AM and 4 PM).  If your symptoms worsen or you develop new symptoms please let us know.   Thank you for coming to see [facility] at [facility]. I hope we have been able to provide you high quality care today.  You may receive a patient satisfaction survey over the next few weeks. We would appreciate your feedback and comments so that we may continue to improve ourselves and the health of our patients.

## 2017-08-17 IMAGING — MR MR THORACIC SPINE WO/W CM
11 of 25 series · 20 of 48 positions shown · IV contrast (multihance)
Comparison: None.

CLINICAL DATA: Acute onset of lower extremity numbness 3 days ago.
Mid thoracic back pain. New diagnosis of Leidi Better
fever.

EXAM:
MRI TOTAL SPINE WITHOUT AND WITH CONTRAST
TECHNIQUE: Multisequence MR imaging of the spine from the cervical spine to the
sacrum was performed prior to and following IV contrast
administration for evaluation of spinal metastatic disease.
CONTRAST:  18mL MULTIHANCE GADOBENATE DIMEGLUMINE 529 MG/ML IV SOLN

[Series 2: T1 fat-sat post-contrast · sagittal · 3.0mm · 0.39mm/px · 1 of 13 slices shown]
[im 1/13]
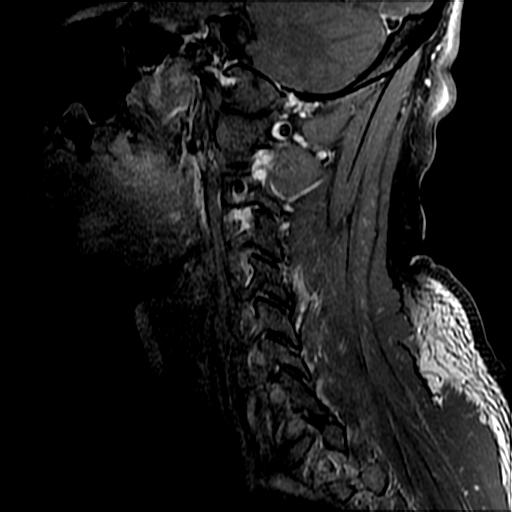

[Series 2: T2 · sagittal · 3.0mm · 0.39mm/px · 2 of 13 slices shown (1 of 6)]
[im 1/13]
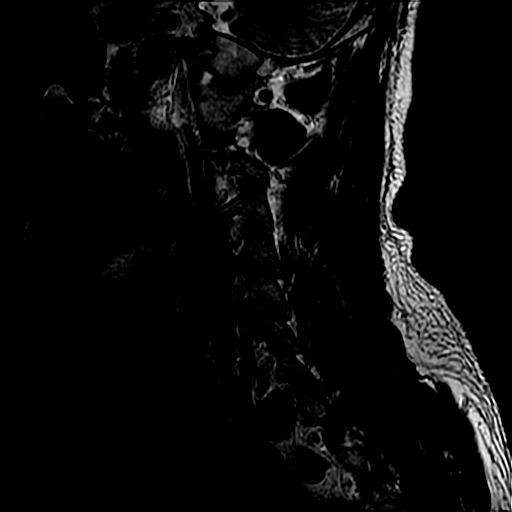
[im 13/13]
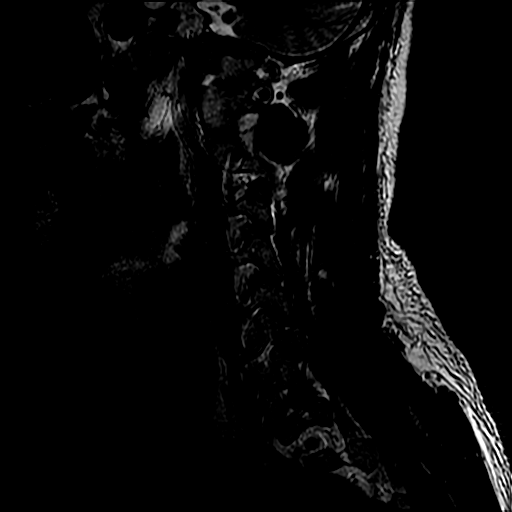

[Series 3: T1 · sagittal · 3.0mm · 0.39mm/px · 1 of 13 slices shown (1 of 4)]
[im 1/13]
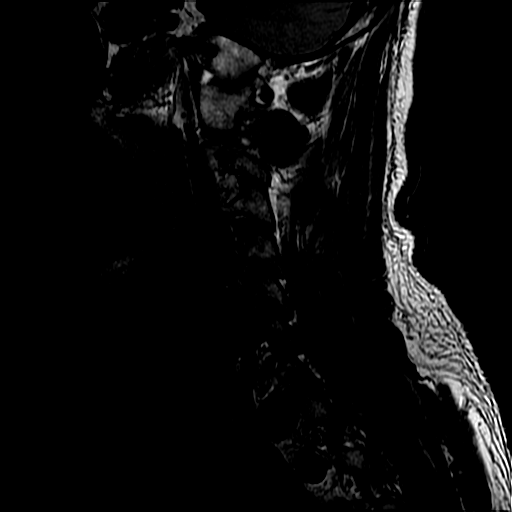

[Series 6: T2 · axial · 3.0mm · 0.39mm/px · z∈[-55,+51]mm · 3 of 32 slices shown (2 of 6)]
[im 1/32]
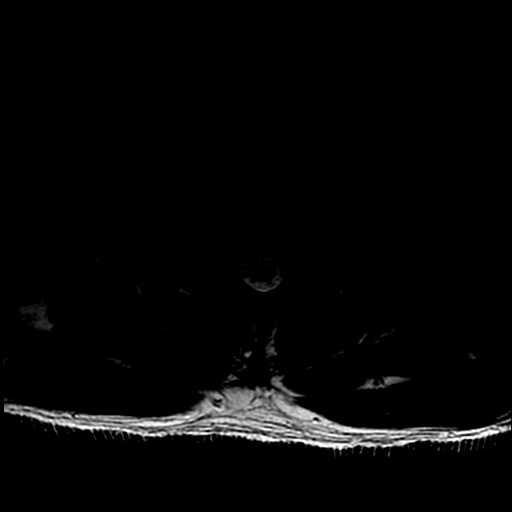
[im 16/32]
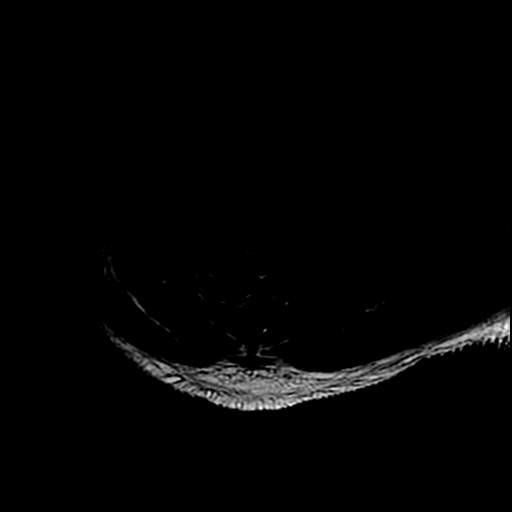
[im 32/32]
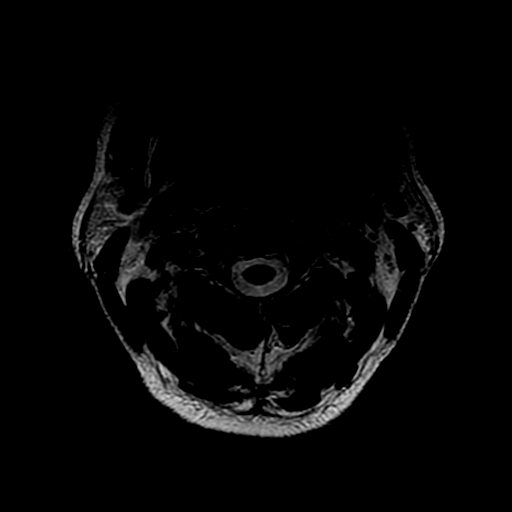

[Series 7: T1 · axial · non-contrast · 3.0mm · 0.70mm/px · z∈[-53,+53]mm · 3 of 32 slices shown (2 of 4)]
[im 1/32]
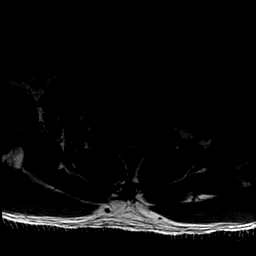
[im 16/32]
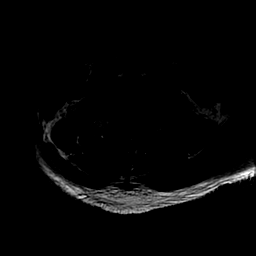
[im 32/32]
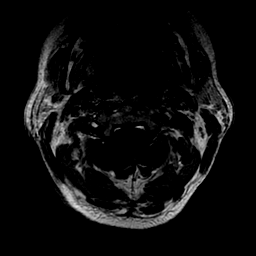

[Series 11: T2 · sagittal · 3.0mm · 0.62mm/px · 1 of 13 slices shown (3 of 6)]
[im 1/13]
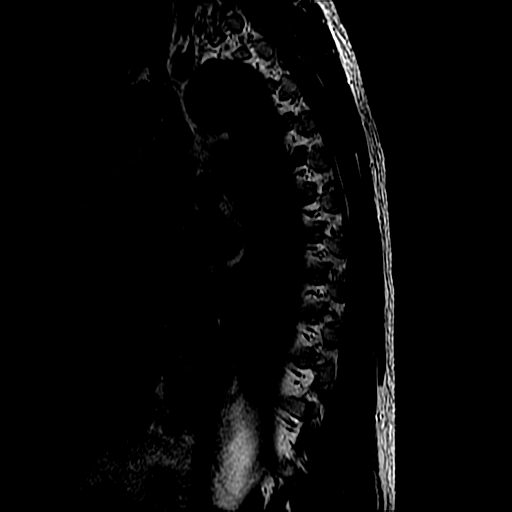

[Series 12: T1 · sagittal · 3.0mm · 0.62mm/px · 1 of 13 slices shown (3 of 4)]
[im 1/13]
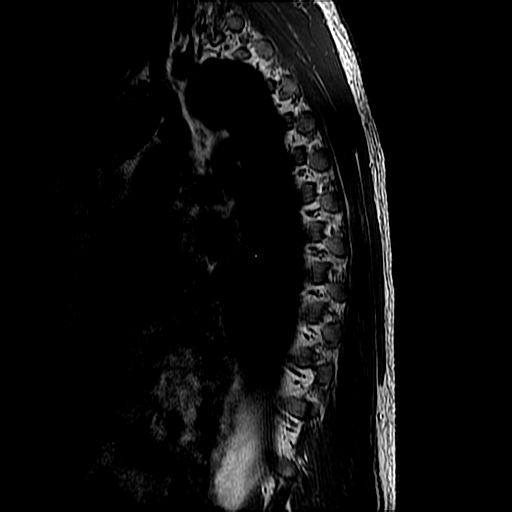

[Series 14: T2 · axial · 4.0mm · 0.43mm/px · z∈[-271,-93]mm · 3 of 38 slices shown (4 of 6)]
[im 1/38]
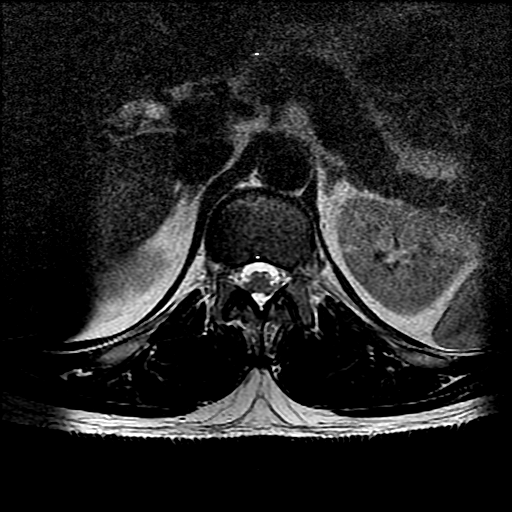
[im 19/38]
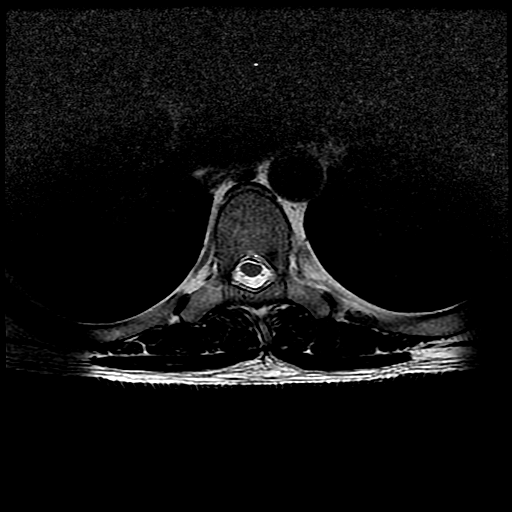
[im 38/38]
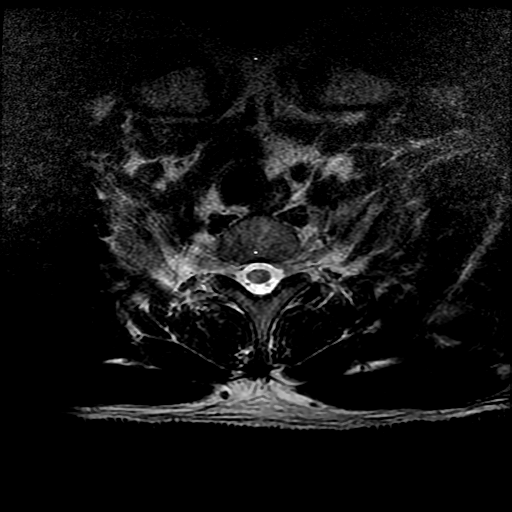

[Series 16: T1 · axial · non-contrast · 4.0mm · 0.43mm/px · 1 of 38 slices shown (4 of 4)]
[im 1/38]
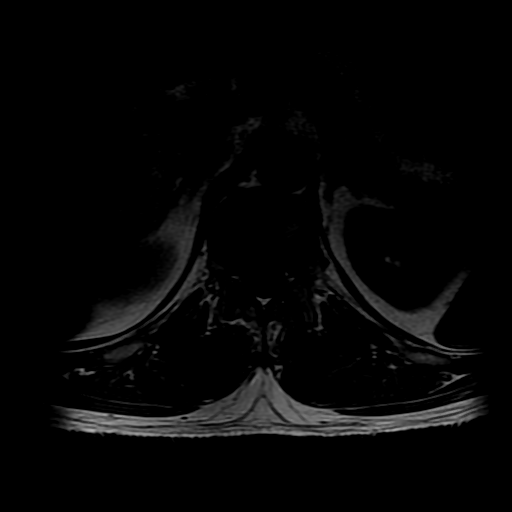

[Series 19: T2 · sagittal · 4.0mm · 0.55mm/px · 1 of 13 slices shown (5 of 6)]
[im 1/13]
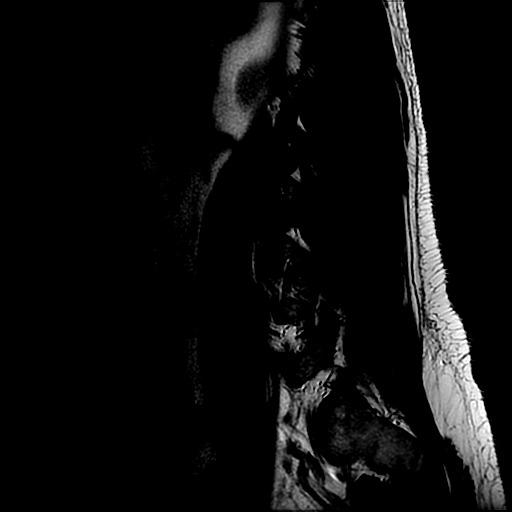

[Series 22: T2 · axial · 4.0mm · 0.39mm/px · z∈[-542,-335]mm · 3 of 36 slices shown (6 of 6)]
[im 1/36]
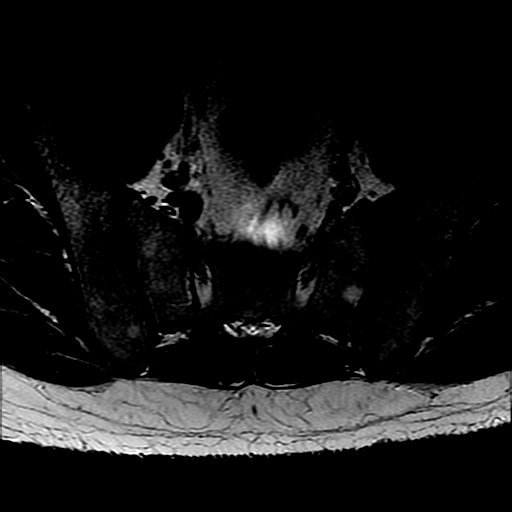
[im 18/36]
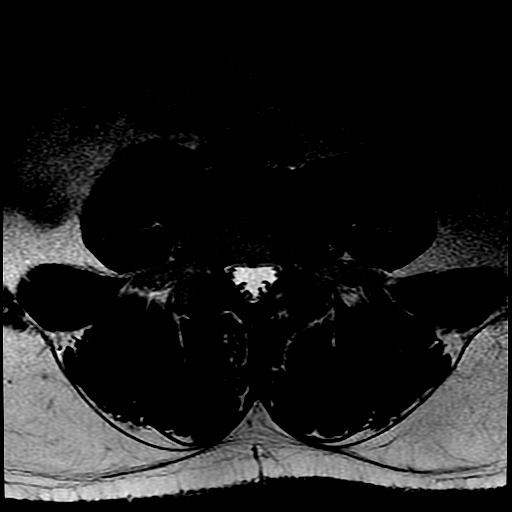
[im 36/36]
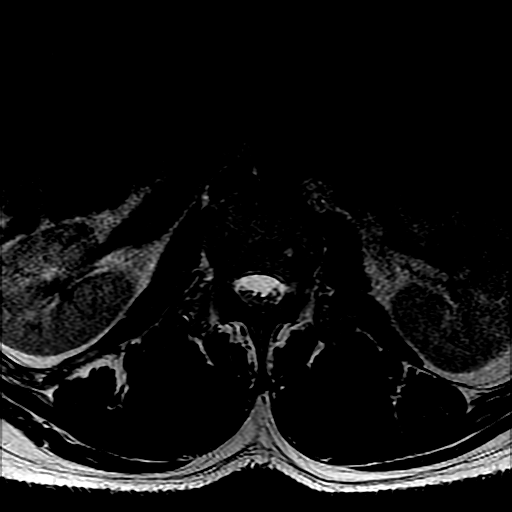

[20 of 48 positions shown; findings below may reference images not displayed]

FINDINGS: Cervical Findings:

The visualized intracranial contents and paraspinal soft tissues are
normal. There is no mass lesion or myelopathy of the cervical spinal
cord. No pathologic enhancement of the spinal cord after contrast
administration. Facet joints are normal throughout the cervical
spine.

Craniocervical junction through C2-3:  Normal.

C3-4: Uncinate osteophytes slightly narrow the right neural foramen.
Otherwise normal.

C4-5: Small central subligamentous disc protrusion slightly
indenting the ventral aspect of the spinal cord without myelopathy.
Otherwise normal.

C5-6: Disc degeneration with slight disc space narrowing asymmetric
to the left. Broad-based disc osteophyte complex slightly compresses
the spinal cord without myelopathy. Minimum AP dimension is 7 mm.
Slight bilateral foraminal narrowing. Both lateral recesses are
narrowed which could affect either or both C6 nerves.

C6-7: Small broad-based disc osteophyte complex without impingement.

C7-T1:  Normal.

Thoracic Findings:

There is a small disc protrusion just to the left of midline at T6-7
indenting the ventral aspect of the left side of the spinal cord
without myelopathy.

Tiny disc bulge at T7-8 with disc desiccation touching the ventral
aspect of the spinal cord just to the left of midline.

Disc degeneration at T8-9 without disc protrusion or bulge.

Small benign hemangioma in the T12 vertebral body.

Thoracic spinal cord has no mass lesion or myelopathy. No pathologic
enhancement after contrast administration.

Lumbar Findings:

No conus tip at T12-L1. No pathologic enhancement after contrast
administration. Paraspinal soft tissues are normal.

L1-2 through L3-4: Normal.

L4-5: Disc desiccation with slight disc space narrowing with a tiny
broad-based disc bulge with no neural impingement.

L5-S1: Normal. Tiny hemangioma in the left side of the L5 vertebra.
IMPRESSION: 1. No significant abnormality of the lumbar spine. Slight
degenerative disc disease at L4-5.
2. Small disc protrusion at T6-7 to the left of midline slightly
indenting the ventral aspect of the spinal cord without myelopathy.
3. Degenerative disc disease at C4-5, C5-6, and C6-7. Minimal
compression of the spinal cord at C5-6 without myelopathy.

## 2017-08-18 IMAGING — RF DG FLUORO GUIDE LUMBAR PUNCTURE
1 series · 1 of 1 positions shown · non-contrast
Comparison: none

CLINICAL DATA: Lower extremity numbness. Possible [employer]
Magaliie fever.

[Series 1: cp_standard · 0.19mm/px · 1 of 1 slices shown]
[im 1/1]
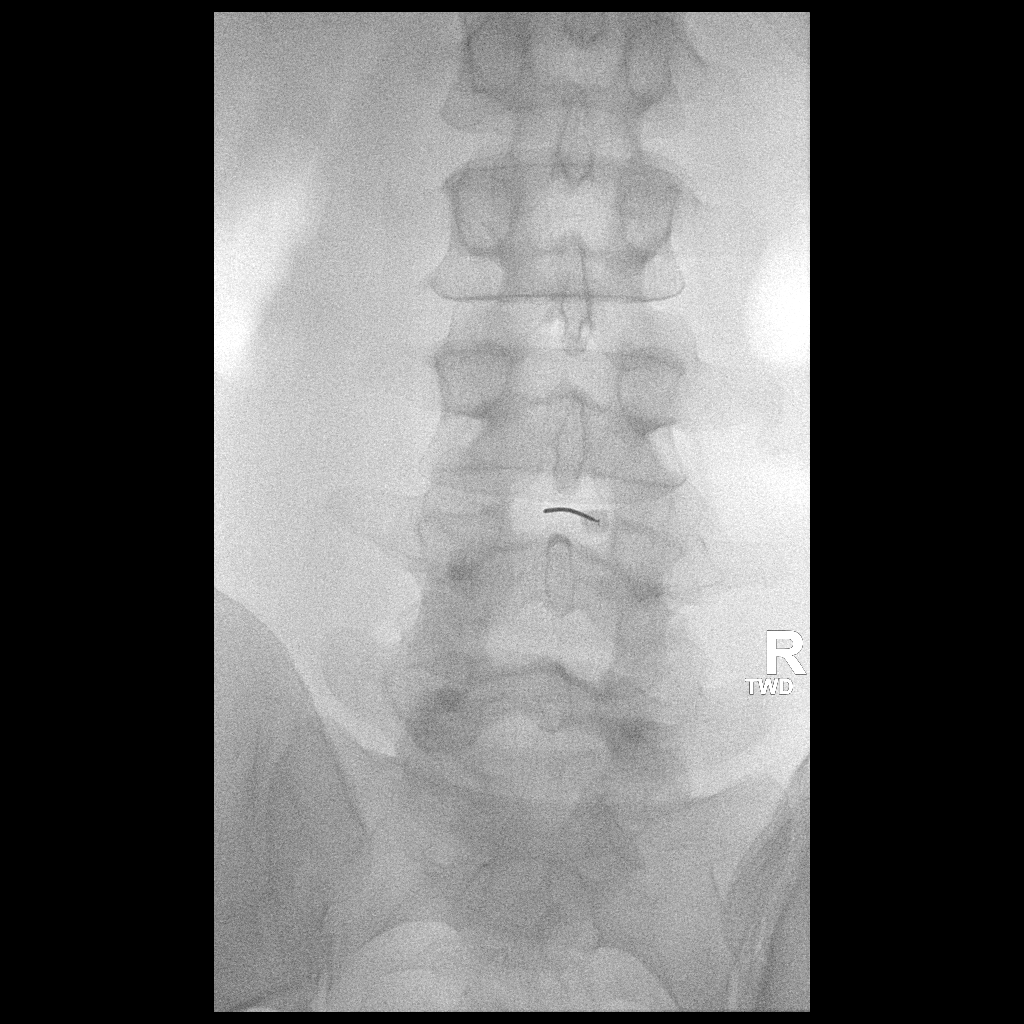

[1 of 1 positions shown; findings below may reference images not displayed]

EXAM:
DIAGNOSTIC LUMBAR PUNCTURE UNDER FLUOROSCOPIC GUIDANCE

FLUOROSCOPY TIME:  Radiation Exposure Index (as provided by the
fluoroscopic device): 2.7 mGy

If the device does not provide the exposure index:

Fluoroscopy Time (in minutes and seconds):  0 minutes and 30 seconds

Number of Acquired Images:

PROCEDURE:
Informed consent was obtained from the patient prior to the
procedure, including potential complications of headache, allergy,
and pain. With the patient prone, the lower back was prepped with
Betadine. 1% Lidocaine was used for local anesthesia. Lumbar
puncture was performed at the L3-4 level using a 20 gauge needle
with return of initially bloody and and then clear CSF. 9 ml of CSF
were obtained for laboratory studies. The patient tolerated the
procedure well and there were no apparent complications.
IMPRESSION: Fluoroscopic guided lumbar puncture with 9 cc of CSF obtained for
appropriate laboratory evaluation.

## 2018-01-29 ENCOUNTER — Ambulatory Visit: Payer: Self-pay | Admitting: Adult Health

## 2018-01-31 ENCOUNTER — Encounter: Payer: Self-pay | Admitting: Neurology

## 2018-01-31 ENCOUNTER — Ambulatory Visit: Payer: 59 | Admitting: Neurology

## 2018-01-31 ENCOUNTER — Other Ambulatory Visit: Payer: Self-pay

## 2018-01-31 VITALS — BP 179/98 | HR 84 | Resp 16 | Ht 68.0 in | Wt 200.0 lb

## 2018-01-31 DIAGNOSIS — N319 Neuromuscular dysfunction of bladder, unspecified: Secondary | ICD-10-CM | POA: Diagnosis not present

## 2018-01-31 DIAGNOSIS — M79672 Pain in left foot: Secondary | ICD-10-CM | POA: Diagnosis not present

## 2018-01-31 DIAGNOSIS — G373 Acute transverse myelitis in demyelinating disease of central nervous system: Secondary | ICD-10-CM

## 2018-01-31 MED ORDER — OXYBUTYNIN CHLORIDE 5 MG PO TABS: 5.0000 mg | ORAL_TABLET | Freq: Two times a day (BID) | ORAL | 3 refills | Status: DC

## 2018-01-31 NOTE — Progress Notes (Signed)
Reason for visit: Transverse myelitis  Kenneth Garza is an 42 y.o. male  History of present illness:  Kenneth Garza is a 42 year old right-handed white male with a history of prior transverse myelitis.  The patient has an associated neurogenic bladder, he has urinary frequency and urgency.  He reports a new problem that has occurred over the last 6 months with some left foot pain that occurs with weightbearing.  The pain is mainly around the left heel and occurs with heightened severity when he first gets out of bed in the morning and bears weight.  The patient is otherwise ambulating fairly well, he will sometimes stumble but he has not had any falls.  He may have incontinence of the bladder at nighttime.  The patient is working, he is climbing ladders, he is lifting heavy weights at work.  The patient is getting a good response from baclofen, he is on a total of 80 mg daily.  He takes 1 full tablet at 4 AM, 12 noon, and 9:30 at night.  He takes 1/2 tablet at 8:30 AM and 4:30 PM.  The patient may have some nausea if he misses a dose of baclofen.  The patient returns to this office for an evaluation.  Past Medical History:  Diagnosis Date  . Neurogenic bladder 03/07/2016  . Transverse myelitis [facility])     Past Surgical History:  Procedure Laterality Date  . EPIDIDYMIS SURGERY  1987    Family History  Problem Relation Age of Onset  . Cancer Maternal Grandfather   . Healthy Sister   . Healthy Brother     Social history:  reports that he has been smoking cigarettes. He has been smoking about 0.25 packs per day. He has never used smokeless tobacco. He reports that he does not drink alcohol or use drugs.   No Known Allergies  Medications:  Prior to Admission medications   Medication Sig Start Date End Date Taking? Authorizing Provider  baclofen (LIORESAL) 20 MG tablet Take 1 tablet PO at 7AM, 2PM and 9PM. Take 1/2 tablet at 11AM and 4PM. 07/12/17  Yes Millikan, Megan, NP  magnesium  oxide (MAG-OX) 400 MG tablet Take 400 mg by mouth daily.   Yes [provider]  oxybutynin (DITROPAN) 5 MG tablet Take 1 tablet (5 mg total) by mouth 2 (two) times daily. 01/31/18   York Spaniel, MD    ROS:  Out of a complete 14 system review of symptoms, the patient complains only of the following symptoms, and all other reviewed systems are negative.  Left foot pain Urinary urgency, frequency, incontinence  Blood pressure (!) 179/98, pulse 84, resp. rate 16, height 5\' 8"  (1.727 m), weight 200 lb (90.7 kg).  Physical Exam  General: The patient is alert and cooperative at the time of the examination.  Skin: No significant peripheral edema is noted.   Neurologic Exam  Mental status: The patient is alert and oriented x 3 at the time of the examination. The patient has apparent normal recent and remote memory, with an apparently normal attention span and concentration ability.   Cranial nerves: Facial symmetry is present. Speech is normal, no aphasia or dysarthria is noted. Extraocular movements are full. Visual fields are full.  Motor: The patient has good strength in all 4 extremities.  Sensory examination: Soft touch sensation is symmetric on the face and arms, decreased on the right leg relative to the left.  Coordination: The patient has good finger-nose-finger and heel-to-shin bilaterally.  Gait and station: The patient has a normal gait. Tandem gait is normal. Romberg is negative. No drift is seen.  Reflexes: Deep tendon reflexes are symmetric.   Assessment/Plan:  1.  History of transverse myelitis  2.  Neurogenic bladder  3.  Left foot pain, probable plantar fasciitis  The patient will be sent for a podiatry referral for his left foot pain.  The patient will be placed on oxybutynin 5 mg twice daily for the bladder, if this does not improve he will call our office and we will refer him to a urology physician.  He also reports decreased sensation in the  genital area, I am not sure there is any treatment for this.  The patient is ambulating quite well, he has inquired about a baclofen pump but I do not think this is indicated as he is getting an excellent response from oral baclofen.  The patient will follow-up in 6 months.  Marlan Palau MD 01/31/2018 7:48 AM  [facility] [address] [place], [place] [address]  Phone [phone] Fax [phone]

## 2018-06-13 ENCOUNTER — Other Ambulatory Visit: Payer: Self-pay | Admitting: Neurology

## 2018-06-25 ENCOUNTER — Telehealth: Payer: Self-pay

## 2018-06-25 NOTE — Telephone Encounter (Signed)
Harvie Heck assistant with Cletis Athens, NP at [facility] called. He states Melissa evaluated the pt today for back injury and Melissa is recommending either a prednisone taper, DepoMedrol or Dexamethasone injection.  Harvie Heck states the pt advised Melissa during the visit at his last f/u in October of 2019 it was recommended by Dr. Anne Hahn for all steroid use to avoided if possible. I reviewed the note from 01/31/2018 and could not find this recommendation stated. I advised Harvie Heck I would fwd message and request Dr. Anne Hahn to review. He was agreeable.   Best call back # is [phone].

## 2018-06-25 NOTE — Telephone Encounter (Signed)
I called Kenneth Garza, I have no indication that there is a contraindication to using prednisone in this patient, I would recommend a course of prednisone with an acute lumbar strain.

## 2018-07-13 ENCOUNTER — Other Ambulatory Visit: Payer: Self-pay | Admitting: Adult Health

## 2018-08-08 ENCOUNTER — Telehealth: Payer: Self-pay | Admitting: Neurology

## 2018-08-08 MED ORDER — MIRABEGRON ER 25 MG PO TB24: 25.0000 mg | ORAL_TABLET | Freq: Every day | ORAL | 3 refills | Status: DC

## 2018-08-08 NOTE — Telephone Encounter (Signed)
Pt called in and stated he would like to discuss his reacting to a med he is taking, pt didn't know the name of the med but did state its for his bladder

## 2018-08-08 NOTE — Telephone Encounter (Signed)
I called the patient.  He is having some sexual side effects on the oxybutynin.  We will stop the oxybutynin and tried Myrbetriq, he will let me know if he still has troubles.

## 2018-08-08 NOTE — Addendum Note (Signed)
Addended by: York Spaniel on: 08/08/2018 11:45 AM   Modules accepted: Orders

## 2018-08-08 NOTE — Telephone Encounter (Signed)
I contacted the pt. Pt reported he has been taking the oxybutin but feels like his erectile dysfunction has worsened. Pt wanted to know if a mediation could be rx'd to help with ED symptoms.  I advised I would fwd to MD to review and advise, pt was agreeable.  Best call back # is [phone].

## 2018-08-13 ENCOUNTER — Other Ambulatory Visit: Payer: Self-pay | Admitting: Neurology

## 2018-08-23 ENCOUNTER — Telehealth: Payer: Self-pay

## 2018-08-23 NOTE — Telephone Encounter (Signed)
I contacted the pt and left a vm in regards to his 08/27/18 appt. Trying to verify if he would be agreeable to doing a virtual video visit.

## 2018-08-27 ENCOUNTER — Ambulatory Visit: Payer: Self-pay | Admitting: Neurology

## 2018-08-27 ENCOUNTER — Ambulatory Visit: Payer: 59 | Admitting: Neurology

## 2018-08-27 NOTE — Telephone Encounter (Signed)
Left second vm to discuss scheduling 6 month virtual video visit. Pt has been removed from 08/27/18 schedule.

## 2018-08-30 ENCOUNTER — Telehealth: Payer: Self-pay | Admitting: Neurology

## 2018-08-30 NOTE — Telephone Encounter (Addendum)
I attempted to reach the pt and left a vm asking him to call me back so we could complete the pre charting for 09/03/18 virtual video visit. [facility] # and hours were provided.

## 2018-08-30 NOTE — Telephone Encounter (Signed)
Link for visit has been sent.  

## 2018-08-30 NOTE — Telephone Encounter (Signed)
Pt consented to a VIRTUAL VISIT and for the insurance to be billed as such. Text message sent. Phone [employer]

## 2018-09-03 ENCOUNTER — Encounter: Payer: Self-pay | Admitting: Neurology

## 2018-09-03 ENCOUNTER — Other Ambulatory Visit: Payer: Self-pay

## 2018-09-03 ENCOUNTER — Ambulatory Visit (INDEPENDENT_AMBULATORY_CARE_PROVIDER_SITE_OTHER): Payer: Managed Care, Other (non HMO) | Admitting: Neurology

## 2018-09-03 DIAGNOSIS — N319 Neuromuscular dysfunction of bladder, unspecified: Secondary | ICD-10-CM | POA: Diagnosis not present

## 2018-09-03 DIAGNOSIS — G373 Acute transverse myelitis in demyelinating disease of central nervous system: Secondary | ICD-10-CM

## 2018-09-03 MED ORDER — BACLOFEN 20 MG PO TABS: 20.0000 mg | ORAL_TABLET | Freq: Three times a day (TID) | ORAL | 3 refills | Status: DC

## 2018-09-03 NOTE — Progress Notes (Signed)
     Virtual Visit via Video Note  I connected with Kenneth Garza on 09/03/18 at  2:30 PM EDT by a video enabled telemedicine application and verified that I am speaking with the correct person using two identifiers.  Location: Patient: The patient is at work. Provider: Physician in office.   I discussed the limitations of evaluation and management by telemedicine and the availability of in person appointments. The patient expressed understanding and agreed to proceed.  History of Present Illness: Kenneth Garza is a 43 year old right-handed white male with a history of a transverse myelitis.  The patient has some spasticity of the lower extremities which seems to be improved with use of baclofen, he is able to maintain gainful employment and stay active.  He is able to climb ladders with safety.  He does have some stiffness in his low back and some low back pain on weekends when he is not as active, his back will stiffen up.  He has had some numbness of the groin area, he has some problems with neurogenic bladder.  Oxybutynin resulted in sexual side effects, he was tried on Myrbetriq but did not tolerate the drug and stopped it.  He claims that his bladder is functioning fairly well at this point.  He did have left foot pain with weightbearing on the last evaluation in the fall 2019, he was sent to a podiatrist and was felt to have plantar fasciitis.  He has been treated for this and has done well.  He has much better arch supports at this point.   Observations/Objective: The video evaluation reveals that the patient is alert and cooperative.  Speech is well enunciated, not aphasic or dysarthric.  He has a symmetric face, he is able to protrude the tongue midline with good lateral movement of the tongue.  Extraocular movements are full.  He has good finger-nose-finger and heel shin bilaterally.  His gait is relatively normal, he is able to perform tandem gait.  Romberg is negative.  No drift is  seen.  Assessment and Plan: 1.  History of transverse myelitis  2.  Neurogenic bladder  3.  Left plantar fasciitis, resolved  The patient will continue the baclofen, without it he stiffens up significantly.  He is to stretch on weekends to prevent the low back pain.  He does not wish to have any further medications for the bladder, he will call me if he requires a referral to urology physician.  He will follow-up otherwise in 6 months.  A prescription for the baclofen was sent in.  Follow Up Instructions: 77-month follow-up, may see nurse practitioner.   I discussed the assessment and treatment plan with the patient. The patient was provided an opportunity to ask questions and all were answered. The patient agreed with the plan and demonstrated an understanding of the instructions.   The patient was advised to call back or seek an in-person evaluation if the symptoms worsen or if the condition fails to improve as anticipated.  I provided 15 minutes of non-face-to-face time during this encounter.   York Spaniel, MD

## 2018-09-19 ENCOUNTER — Telehealth: Payer: Self-pay | Admitting: Neurology

## 2018-09-19 NOTE — Telephone Encounter (Signed)
LVM to schedule 6 month follow-up with Sarah NP per Dr. Willis. °

## 2019-09-05 ENCOUNTER — Other Ambulatory Visit: Payer: Self-pay | Admitting: Neurology

## 2019-09-10 ENCOUNTER — Telehealth: Payer: Self-pay | Admitting: Neurology

## 2019-09-10 ENCOUNTER — Other Ambulatory Visit: Payer: Self-pay

## 2019-09-10 MED ORDER — BACLOFEN 20 MG PO TABS: 20.0000 mg | ORAL_TABLET | Freq: Three times a day (TID) | ORAL | 0 refills | Status: DC

## 2019-09-10 NOTE — Telephone Encounter (Signed)
Pt called needing a refill on his baclofen (LIORESAL) 20 MG tablet sent in to the [facility] in [place] on [address].

## 2019-09-10 NOTE — Telephone Encounter (Signed)
I called pt that he was seen 08/2018 and needs an appt to continue refills. I schedule pt with Sarah NP on June 23,2021 at 0815am. Pt stated he prefer afternoon because he works 5am to 2pm. He will let his job know and be at the appt. I stated a temporary refill will be sent in . I advise pt he needs to be seen yearly for medication management.

## 2019-10-09 ENCOUNTER — Encounter: Payer: Self-pay | Admitting: Neurology

## 2019-10-09 ENCOUNTER — Other Ambulatory Visit: Payer: Self-pay

## 2019-10-09 ENCOUNTER — Ambulatory Visit (INDEPENDENT_AMBULATORY_CARE_PROVIDER_SITE_OTHER): Payer: PRIVATE HEALTH INSURANCE | Admitting: Neurology

## 2019-10-09 VITALS — BP 136/95 | HR 71 | Ht 66.0 in | Wt 192.0 lb

## 2019-10-09 DIAGNOSIS — R202 Paresthesia of skin: Secondary | ICD-10-CM | POA: Diagnosis not present

## 2019-10-09 DIAGNOSIS — G373 Acute transverse myelitis in demyelinating disease of central nervous system: Secondary | ICD-10-CM

## 2019-10-09 DIAGNOSIS — N319 Neuromuscular dysfunction of bladder, unspecified: Secondary | ICD-10-CM

## 2019-10-09 DIAGNOSIS — M79601 Pain in right arm: Secondary | ICD-10-CM

## 2019-10-09 DIAGNOSIS — M79602 Pain in left arm: Secondary | ICD-10-CM

## 2019-10-09 MED ORDER — BACLOFEN 20 MG PO TABS: 20.0000 mg | ORAL_TABLET | Freq: Three times a day (TID) | ORAL | 2 refills | Status: DC

## 2019-10-09 NOTE — Patient Instructions (Signed)
I will order nerve conduction evaluation of your symptoms  Continue Baclofen  See you back 6 months

## 2019-10-09 NOTE — Progress Notes (Signed)
I have read the note, and I agree with the clinical assessment and plan.  Jakaree Pickard K Arshi Duarte   

## 2019-10-09 NOTE — Progress Notes (Signed)
PATIENT: Kenneth Garza DOB: 05-13-1975  REASON FOR VISIT: follow up HISTORY FROM: patient  HISTORY OF PRESENT ILLNESS: Today 10/09/19  Kenneth Garza is a 44 year old male with history of transverse myelitis.  He is on baclofen for lower extremity spasticity.  He has had some neurogenic bladder.  He could not tolerate oxybutynin or Myrbetriq. He may have urinary incontinence 3 times a year, usually when tired or hot.  Notes, the hotter he feels, the more numb he feels.  Mostly has symptoms in the low back and legs, more pain on the right side, more numbness on the left side, but feels the left side works better, the right side feels heavier-this is all chronic.  No falls.  He is gainfully employed full-time, his job is physically demanding, he operates a saw to cut cars.  Notes, numbness to both hands, preceding his transverse myelitis, over the last few years has worsened.  Notices when driving with hands on the steering wheel, will get numbness to both hands before arriving at his destination, improves with shaking the hands.  Also notes this, when operating the saw at work.  His transverse myelitis symptoms are overall stable.  He presents today for evaluation unaccompanied.  HISTORY 09/03/2018 Dr. Jannifer Franklin: Kenneth Garza is a 44 year old right-handed white male with a history of a transverse myelitis.  The patient has some spasticity of the lower extremities which seems to be improved with use of baclofen, he is able to maintain gainful employment and stay active.  He is able to climb ladders with safety.  He does have some stiffness in his low back and some low back pain on weekends when he is not as active, his back will stiffen up.  He has had some numbness of the groin area, he has some problems with neurogenic bladder.  Oxybutynin resulted in sexual side effects, he was tried on Myrbetriq but did not tolerate the drug and stopped it.  He claims that his bladder is functioning fairly well at  this point.  He did have left foot pain with weightbearing on the last evaluation in the fall 2019, he was sent to a podiatrist and was felt to have plantar fasciitis.  He has been treated for this and has done well.  He has much better arch supports at this point.   REVIEW OF SYSTEMS: Out of a complete 14 system review of symptoms, the patient complains only of the following symptoms, and all other reviewed systems are negative.  Numbness  ALLERGIES: No Known Allergies  HOME MEDICATIONS: Outpatient Medications Prior to Visit  Medication Sig Dispense Refill  . magnesium oxide (MAG-OX) 400 MG tablet Take 400 mg by mouth daily.    . baclofen (LIORESAL) 20 MG tablet Take 1 tablet (20 mg total) by mouth 3 (three) times daily. 270 tablet 0   No facility-administered medications prior to visit.    PAST MEDICAL HISTORY: Past Medical History:  Diagnosis Date  . Neurogenic bladder 03/07/2016  . Transverse myelitis ([place])     PAST SURGICAL HISTORY: Past Surgical History:  Procedure Laterality Date  . EPIDIDYMIS SURGERY  1987    FAMILY HISTORY: Family History  Problem Relation Age of Onset  . Cancer Maternal Grandfather   . Healthy Sister   . Healthy Brother     SOCIAL HISTORY: Social History   Socioeconomic History  . Marital status: Single    Spouse name: Not on file  . Number of children: 1  . Years of education:  12  . Highest education level: Not on file  Occupational History  . Occupation: Surveyor, mining  Tobacco Use  . Smoking status: Current Every Day Smoker    Packs/day: 0.25    Types: Cigarettes  . Smokeless tobacco: Never Used  Substance and Sexual Activity  . Alcohol use: No    Alcohol/week: 0.0 standard drinks    Comment: Occasional  . Drug use: No    Comment: Occasional marijuana  . Sexual activity: Not on file  Other Topics Concern  . Not on file  Social History Narrative   Lives at home w/ girlfriend, Efraim Kaufmann   Right-handed   Occasionally drinks  12 oz Pepsi   Social Determinants of Health   Financial Resource Strain:   . Difficulty of Paying Living Expenses:   Food Insecurity:   . Worried About Programme researcher, broadcasting/film/video in the Last Year:   . Barista in the Last Year:   Transportation Needs:   . Freight forwarder (Medical):   Marland Kitchen Lack of Transportation (Non-Medical):   Physical Activity:   . Days of Exercise per Week:   . Minutes of Exercise per Session:   Stress:   . Feeling of Stress :   Social Connections:   . Frequency of Communication with Friends and Family:   . Frequency of Social Gatherings with Friends and Family:   . Attends Religious Services:   . Active Member of Clubs or Organizations:   . Attends Banker Meetings:   Marland Kitchen Marital Status:   Intimate Partner Violence:   . Fear of Current or Ex-Partner:   . Emotionally Abused:   Marland Kitchen Physically Abused:   . Sexually Abused:    PHYSICAL EXAM  Vitals:   10/09/19 0757  BP: (!) 136/95  Pulse: 71  Weight: 192 lb (87.1 kg)  Height: 5\' 6"  (1.676 m)   Body mass index is 30.99 kg/m.  Generalized: Well developed, in no acute distress   Neurological examination  Mentation: Alert oriented to time, place, history taking. Follows all commands speech and language fluent Cranial nerve II-XII: Pupils were equal round reactive to light. Extraocular movements were full, visual field were full on confrontational test. Facial sensation and strength were normal.  Head turning and shoulder shrug  were normal and symmetric. Motor: Good strength of all extremities, no significant weakness was noted, good grip strength bilaterally, hip flexion 5/5 bilaterally Sensory: Decreased sensation to right leg with soft touch, positive Phalen sign Coordination: Cerebellar testing reveals good finger-nose-finger and heel-to-shin bilaterally.  Gait and station: Gait is normal. Tandem gait is normal. Romberg is negative. No drift is seen.  Reflexes: Deep tendon reflexes are  symmetric and normal bilaterally.   DIAGNOSTIC DATA (LABS, IMAGING, TESTING) - I reviewed patient records, labs, notes, testing and imaging myself where available.  Lab Results  Component Value Date   WBC 5.4 08/19/2015   HGB 14.6 08/19/2015   HCT 42.6 08/19/2015   MCV 90.8 08/19/2015   PLT 230 08/19/2015      Component Value Date/Time   NA 142 08/19/2015 1056   K 4.0 08/19/2015 1056   CL 111 08/19/2015 1056   CO2 22 08/19/2015 1056   GLUCOSE 101 (H) 08/19/2015 1056   BUN 8 08/19/2015 1056   CREATININE 0.87 08/19/2015 1056   CALCIUM 9.3 08/19/2015 1056   PROT 6.7 08/19/2015 1056   ALBUMIN 4.1 08/19/2015 1056   AST 17 08/19/2015 1056   ALT 16 (L) 08/19/2015 1056  ALKPHOS 46 08/19/2015 1056   BILITOT 0.8 08/19/2015 1056   GFRNONAA >60 08/19/2015 1056   GFRAA >60 08/19/2015 1056   No results found for: CHOL, HDL, LDLCALC, LDLDIRECT, TRIG, CHOLHDL No results found for: JHER7E Lab Results  Component Value Date   VITAMINB12 490 09/02/2015   No results found for: TSH  ASSESSMENT AND PLAN 44 y.o. year old male  has a past medical history of Neurogenic bladder (03/07/2016) and Transverse myelitis ([facility]). here with:  1.  History of transverse myelitis 2.  Neurogenic bladder 3.  Numbness, paresthesia to bilateral hands  His transverse myelitis appears to remain overall stable, he continues to benefit with baclofen, without it he will stiffen up significantly.  He will remain on baclofen 20 mg 3 times a day.  He describes symptoms of numbness to both hands, have been present for several years, but have worsened of recent.  I will order nerve conduction evaluation, possible carpal tunnel?  He will follow-up in 6 months or sooner if needed.  I spent 30 minutes of face-to-face and non-face-to-face time with patient.  This included previsit chart review, lab review, study review, order entry, electronic health record documentation, patient education.  Margie Ege, AGNP-C, DNP  10/09/2019, 8:27 AM [facility] [address] [place], [place] [address] [phone]

## 2019-11-05 ENCOUNTER — Other Ambulatory Visit: Payer: Self-pay

## 2019-11-05 ENCOUNTER — Ambulatory Visit (INDEPENDENT_AMBULATORY_CARE_PROVIDER_SITE_OTHER): Payer: PRIVATE HEALTH INSURANCE | Admitting: Neurology

## 2019-11-05 ENCOUNTER — Encounter: Payer: Self-pay | Admitting: Neurology

## 2019-11-05 DIAGNOSIS — G5603 Carpal tunnel syndrome, bilateral upper limbs: Secondary | ICD-10-CM

## 2019-11-05 DIAGNOSIS — R202 Paresthesia of skin: Secondary | ICD-10-CM | POA: Diagnosis not present

## 2019-11-05 HISTORY — DX: Carpal tunnel syndrome, bilateral upper limbs: G56.03

## 2019-11-05 NOTE — Progress Notes (Signed)
Please refer to EMG and nerve conduction procedure note.  

## 2019-11-05 NOTE — Procedures (Signed)
     HISTORY:  Kenneth Garza is a 43 year old gentleman with a prior history of transverse myelitis who has a 10-year history of hand numbness bilaterally, right greater than left.  The numbness has gotten gradually worse over time, he is not sleeping well because of this.  He is being evaluated for a possible neuropathy or a cervical radiculopathy.  NERVE CONDUCTION STUDIES:  Nerve conduction studies were performed on both upper extremities.  The distal motor latencies for the median nerves were prolonged bilaterally with low motor amplitudes for these nerves bilaterally.  The distal motor latencies and motor amplitudes for the ulnar nerves were normal bilaterally.  The nerve conduction velocities for the left median nerve and for the ulnar nerves bilaterally were normal, there was slowing for the right median nerve.  The sensory latencies for the median nerves were unobtainable on the right and prolonged on the left and normal for the ulnar nerves bilaterally.  The F-wave latencies for the ulnar nerves were normal bilaterally.  EMG STUDIES:  EMG study was performed on the right upper extremity:  The first dorsal interosseous muscle reveals 2 to 4 K units with full recruitment. No fibrillations or positive waves were noted. The abductor pollicis brevis muscle reveals 2 to 4 K units with full recruitment. No fibrillations or positive waves were noted. The extensor indicis proprius muscle reveals 1 to 3 K units with full recruitment. No fibrillations or positive waves were noted. The pronator teres muscle reveals 2 to 3 K units with full recruitment. No fibrillations or positive waves were noted. The biceps muscle reveals 1 to 2 K units with full recruitment. No fibrillations or positive waves were noted. The triceps muscle reveals 2 to 4 K units with full recruitment. No fibrillations or positive waves were noted. The anterior deltoid muscle reveals 2 to 3 K units with full recruitment. No  fibrillations or positive waves were noted. The cervical paraspinal muscles were tested at 2 levels. No abnormalities of insertional activity were seen at either level tested. There was good relaxation.   IMPRESSION:  The nerve conduction studies done on both upper extremities were notable for bilateral carpal tunnel syndrome of severe severity on the right and moderate severity on the left.  EMG of the right upper extremity was relatively unremarkable without evidence of an overlying cervical radiculopathy.  The EMG evaluation suggests that the carpal tunnel syndrome is mainly demyelinating in nature without significant axonal injury to the distal median nerve.  Marlan Palau MD 11/05/2019 3:52 PM  [facility] [address] [place], [place] [address]  Phone [phone] Fax [phone]

## 2019-11-05 NOTE — Progress Notes (Addendum)
The patient comes in for EMG nerve conduction study, there is evidence of bilateral carpal tunnel syndrome that is worse on the right than the left.  The patient has had symptoms for greater than 10 years but it has gotten much worse in the last 6 to 12 months.  Referral to hand surgeon will be made.     MNC    Nerve / Sites Muscle Latency Ref. Amplitude Ref. Rel Amp Segments Distance Velocity Ref. Area    ms ms mV mV %  cm m/s m/s mVms  L Median - APB     Wrist APB 8.5 ?4.4 1.1 ?4.0 100 Wrist - APB 7   2.9     Upper arm APB 11.5  3.8  354 Upper arm - Wrist 21 70 ?49 15.8     Ulnar Wrist APB 2.6  1.0  27 Ulnar Wrist - APB    2.6  R Median - APB     Wrist APB 11.5 ?4.4 0.4 ?4.0 100 Wrist - APB 7   2.0     Upper arm APB 16.5  0.4  87.8 Upper arm - Wrist 20 40 ?49 1.7  L Ulnar - ADM     Wrist ADM 2.7 ?3.3 9.9 ?6.0 100 Wrist - ADM 7   27.7     B.Elbow ADM 5.7  8.9  90.6 B.Elbow - Wrist 19 65 ?49 25.5     A.Elbow ADM 7.2  9.2  103 A.Elbow - B.Elbow 10 67 ?49 26.7  R Ulnar - ADM     Wrist ADM 2.3 ?3.3 9.9 ?6.0 100 Wrist - ADM 7   26.5     B.Elbow ADM 5.2  9.8  98.7 B.Elbow - Wrist 18 62 ?49 25.9     A.Elbow ADM 6.8  9.7  98.9 A.Elbow - B.Elbow 10 63 ?49 25.7             SNC    Nerve / Sites Rec. Site Peak Lat Ref.  Amp Ref. Segments Distance    ms ms V V  cm  L Median - Orthodromic (Dig II, Mid palm)     Dig II Wrist 5.1 ?3.4 4 ?10 Dig II - Wrist 13  R Median - Orthodromic (Dig II, Mid palm)     Dig II Wrist NR ?3.4 NR ?10 Dig II - Wrist 13  L Ulnar - Orthodromic, (Dig V, Mid palm)     Dig V Wrist 2.6 ?3.1 6 ?5 Dig V - Wrist 11  R Ulnar - Orthodromic, (Dig V, Mid palm)     Dig V Wrist 2.7 ?3.1 6 ?5 Dig V - Wrist 68              F  Wave    Nerve F Lat Ref.   ms ms  L Ulnar - ADM 26.8 ?32.0  R Ulnar - ADM 26.0 ?32.0

## 2019-12-04 ENCOUNTER — Other Ambulatory Visit: Payer: Self-pay | Admitting: Neurology

## 2020-03-30 ENCOUNTER — Ambulatory Visit: Payer: PRIVATE HEALTH INSURANCE | Admitting: Neurology

## 2020-04-01 ENCOUNTER — Encounter: Payer: Self-pay | Admitting: Neurology

## 2020-04-01 ENCOUNTER — Ambulatory Visit (INDEPENDENT_AMBULATORY_CARE_PROVIDER_SITE_OTHER): Payer: PRIVATE HEALTH INSURANCE | Admitting: Neurology

## 2020-04-01 VITALS — BP 152/86 | HR 76 | Ht 66.0 in | Wt 192.0 lb

## 2020-04-01 DIAGNOSIS — G373 Acute transverse myelitis in demyelinating disease of central nervous system: Secondary | ICD-10-CM | POA: Diagnosis not present

## 2020-04-01 DIAGNOSIS — G5603 Carpal tunnel syndrome, bilateral upper limbs: Secondary | ICD-10-CM

## 2020-04-01 MED ORDER — BACLOFEN 20 MG PO TABS: 20.0000 mg | ORAL_TABLET | Freq: Three times a day (TID) | ORAL | 2 refills | Status: DC

## 2020-04-01 NOTE — Progress Notes (Signed)
PATIENT: Kenneth Garza DOB: 1976/01/16  REASON FOR VISIT: follow up HISTORY FROM: patient  HISTORY OF PRESENT ILLNESS: Today 04/01/20 Kenneth Garza is a 44 year old male with history of transverse myelitis.  He is on baclofen for lower extremity spasticity, has had neurogenic bladder, cannot tolerate oxybutynin or Myrbetriq.  Mostly his symptoms are in the low back and legs, more pain on the right side, more numbness on the left side, but the left side works better, the right side feels heavier. Had NCV/EMG that showed bilateral carpal tunnel worse on the right, was referred to a hand surgeon. Has to find a Careers adviser in his network.  Switched jobs, is now less physical, more of a desk job, he finds not moving around, bothers his legs more, was a promotion.  Bladder is stable, no frequent urinary incontinence.  He is due for physical.  He stopped smoking 2 weeks ago, but is vaping now. He takes his baclofen 3:30 am, 12 pm, 7:30 pm.  Overall stable.  Presents today for evaluation unaccompanied.  HISTORY  10/09/2019 SS: Kenneth Garza is a 44 year old male with history of transverse myelitis.  He is on baclofen for lower extremity spasticity.  He has had some neurogenic bladder.  He could not tolerate oxybutynin or Myrbetriq. He may have urinary incontinence 3 times a year, usually when tired or hot.  Notes, the hotter he feels, the more numb he feels.  Mostly has symptoms in the low back and legs, more pain on the right side, more numbness on the left side, but feels the left side works better, the right side feels heavier-this is all chronic.  No falls.  He is gainfully employed full-time, his job is physically demanding, he operates a saw to cut cars.  Notes, numbness to both hands, preceding his transverse myelitis, over the last few years has worsened.  Notices when driving with hands on the steering wheel, will get numbness to both hands before arriving at his destination, improves with shaking the  hands.  Also notes this, when operating the saw at work.  His transverse myelitis symptoms are overall stable.  He presents today for evaluation unaccompanied.  REVIEW OF SYSTEMS: Out of a complete 14 system review of symptoms, the patient complains only of the following symptoms, and all other reviewed systems are negative.  Spasticity  ALLERGIES: No Known Allergies  HOME MEDICATIONS: Outpatient Medications Prior to Visit  Medication Sig Dispense Refill  . magnesium oxide (MAG-OX) 400 MG tablet Take 400 mg by mouth daily.    . baclofen (LIORESAL) 20 MG tablet Take 1 tablet (20 mg total) by mouth 3 (three) times daily. 270 tablet 2   No facility-administered medications prior to visit.    PAST MEDICAL HISTORY: Past Medical History:  Diagnosis Date  . Bilateral carpal tunnel syndrome 11/05/2019  . Neurogenic bladder 03/07/2016  . Transverse myelitis ([facility])     PAST SURGICAL HISTORY: Past Surgical History:  Procedure Laterality Date  . EPIDIDYMIS SURGERY  1987    FAMILY HISTORY: Family History  Problem Relation Age of Onset  . Cancer Maternal Grandfather   . Healthy Sister   . Healthy Brother     SOCIAL HISTORY: Social History   Socioeconomic History  . Marital status: Single    Spouse name: Not on file  . Number of children: 1  . Years of education: 49  . Highest education level: Not on file  Occupational History  . Occupation: Surveyor, mining  Tobacco Use  .  Smoking status: Current Every Day Smoker    Packs/day: 0.25    Types: Cigarettes  . Smokeless tobacco: Never Used  Substance and Sexual Activity  . Alcohol use: No    Alcohol/week: 0.0 standard drinks    Comment: Occasional  . Drug use: No    Comment: Occasional marijuana  . Sexual activity: Not on file  Other Topics Concern  . Not on file  Social History Narrative   Lives at home w/ girlfriend, Efraim Kaufmann   Right-handed   Occasionally drinks 12 oz Pepsi   Social Determinants of Health    Financial Resource Strain: Not on file  Food Insecurity: Not on file  Transportation Needs: Not on file  Physical Activity: Not on file  Stress: Not on file  Social Connections: Not on file  Intimate Partner Violence: Not on file   PHYSICAL EXAM  Vitals:   04/01/20 0759  BP: (!) 152/86  Pulse: 76  Weight: 192 lb (87.1 kg)  Height: 5\' 6"  (1.676 m)   Body mass index is 30.99 kg/m.  Generalized: Well developed, in no acute distress   Neurological examination  Mentation: Alert oriented to time, place, history taking. Follows all commands speech and language fluent Cranial nerve II-XII: Pupils were equal round reactive to light. Extraocular movements were full, visual field were full on confrontational test. Facial sensation and strength were normal. Head turning and shoulder shrug  were normal and symmetric. Motor: The motor testing reveals 5 over 5 strength of all 4 extremities. Good symmetric motor tone is noted throughout.  Sensory: Sensory testing is intact to soft touch on all 4 extremities, but increased to the right lower extremity Coordination: Cerebellar testing reveals good finger-nose-finger and heel-to-shin bilaterally.  Gait and station: Gait is normal. Tandem gait is normal. Romberg is negative. No drift is seen.  Reflexes: Deep tendon reflexes are symmetric but increased at the knees  DIAGNOSTIC DATA (LABS, IMAGING, TESTING) - I reviewed patient records, labs, notes, testing and imaging myself where available.  Lab Results  Component Value Date   WBC 5.4 08/19/2015   HGB 14.6 08/19/2015   HCT 42.6 08/19/2015   MCV 90.8 08/19/2015   PLT 230 08/19/2015      Component Value Date/Time   NA 142 08/19/2015 1056   K 4.0 08/19/2015 1056   CL 111 08/19/2015 1056   CO2 22 08/19/2015 1056   GLUCOSE 101 (H) 08/19/2015 1056   BUN 8 08/19/2015 1056   CREATININE 0.87 08/19/2015 1056   CALCIUM 9.3 08/19/2015 1056   PROT 6.7 08/19/2015 1056   ALBUMIN 4.1 08/19/2015  1056   AST 17 08/19/2015 1056   ALT 16 (L) 08/19/2015 1056   ALKPHOS 46 08/19/2015 1056   BILITOT 0.8 08/19/2015 1056   GFRNONAA >60 08/19/2015 1056   GFRAA >60 08/19/2015 1056   No results found for: CHOL, HDL, LDLCALC, LDLDIRECT, TRIG, CHOLHDL No results found for: 10/19/2015 Lab Results  Component Value Date   VITAMINB12 490 09/02/2015   No results found for: TSH  ASSESSMENT AND PLAN 44 y.o. year old male  has a past medical history of Bilateral carpal tunnel syndrome (11/05/2019), Neurogenic bladder (03/07/2016), and Transverse myelitis ([facility]). here with:  1.  History of transverse myelitis 2.  Neurogenic bladder 3.  Bilateral carpal tunnel syndrome  He remains overall stable, he continues to benefit from baclofen.  He will remain on baclofen 20 mg 3 times a day. NCV/EMG has shown bilateral carpal tunnel, he has been referred to a  Hydrographic surveyor.  He has to check with his insurance company, find somebody in his network.  I will provide a referral when I hear back from him.  Recommended he see his PCP for routine physical, BP up today.  Encouraged him to continue his efforts in smoking cessation.  He will follow-up in 6 months or sooner if needed.  I spent 20 minutes of face-to-face and non-face-to-face time with patient.  This included previsit chart review, lab review, study review, order entry, electronic health record documentation, patient education.  Margie Ege, AGNP-C, DNP 04/01/2020, 8:24 AM [facility] [address], [address] [place], [place] [address] [phone]

## 2020-04-01 NOTE — Patient Instructions (Signed)
Continue baclofen  See your primary doctor for physical  Call when you determine hand surgeon in your network, I can send a referral  See you back in 6 months

## 2020-04-01 NOTE — Progress Notes (Signed)
I have read the note, and I agree with the clinical assessment and plan.  Yanky Vanderburg K Tereasa Yilmaz   

## 2020-09-04 ENCOUNTER — Other Ambulatory Visit: Payer: Self-pay | Admitting: Neurology

## 2020-10-01 ENCOUNTER — Other Ambulatory Visit: Payer: Self-pay

## 2020-10-01 ENCOUNTER — Encounter: Payer: Self-pay | Admitting: Neurology

## 2020-10-01 ENCOUNTER — Ambulatory Visit (INDEPENDENT_AMBULATORY_CARE_PROVIDER_SITE_OTHER): Payer: PRIVATE HEALTH INSURANCE | Admitting: Neurology

## 2020-10-01 VITALS — BP 130/80 | HR 54 | Ht 66.0 in | Wt 197.0 lb

## 2020-10-01 DIAGNOSIS — G373 Acute transverse myelitis in demyelinating disease of central nervous system: Secondary | ICD-10-CM | POA: Diagnosis not present

## 2020-10-01 DIAGNOSIS — N319 Neuromuscular dysfunction of bladder, unspecified: Secondary | ICD-10-CM

## 2020-10-01 MED ORDER — BACLOFEN 20 MG PO TABS: 20.0000 mg | ORAL_TABLET | Freq: Three times a day (TID) | ORAL | 11 refills | Status: DC

## 2020-10-01 NOTE — Progress Notes (Signed)
PATIENT: Kenneth Garza DOB: 07-26-1975  REASON FOR VISIT: follow up HISTORY FROM: patient  HISTORY OF PRESENT ILLNESS: Today 10/01/20 Kenneth Garza is a 45 year old male with history of transverse myelitis.  On baclofen for lower extremity spasticity.  Has neurogenic bladder, cannot tolerate oxybutynin or Myrbetriq. Bladder is doing better, denies any incontinence. Had NCV/EMG that showed bilateral carpal tunnel worse on the right, was referred to a hand surgeon. Is waiting on this, until insurance changes.  His insurance will not pay for 90-day supply of baclofen.  He has noted if he misses a dose, he feels tightness in his lower extremities.  He continues to work full-time, has a Office manager, agrees with him better than his prior physical work. The heat does make his symptoms worse. Is overall pleased with his condition. Getting a new kayak that isn't so low. Here today unaccompanied.  Update 04/01/2020 SS: Kenneth Garza is a 46 year old male with history of transverse myelitis.  He is on baclofen for lower extremity spasticity, has had neurogenic bladder, cannot tolerate oxybutynin or Myrbetriq.  Mostly his symptoms are in the low back and legs, more pain on the right side, more numbness on the left side, but the left side works better, the right side feels heavier. Had NCV/EMG that showed bilateral carpal tunnel worse on the right, was referred to a hand surgeon. Has to find a Careers adviser in his network.  Switched jobs, is now less physical, more of a desk job, he finds not moving around, bothers his legs more, was a promotion.  Bladder is stable, no frequent urinary incontinence.  He is due for physical.  He stopped smoking 2 weeks ago, but is vaping now. He takes his baclofen 3:30 am, 12 pm, 7:30 pm.  Overall stable.  Presents today for evaluation unaccompanied.  HISTORY  10/09/2019 SS: Kenneth Garza is a 45 year old male with history of transverse myelitis.  He is on baclofen for lower extremity  spasticity.  He has had some neurogenic bladder.  He could not tolerate oxybutynin or Myrbetriq. He may have urinary incontinence 3 times a year, usually when tired or hot.  Notes, the hotter he feels, the more numb he feels.  Mostly has symptoms in the low back and legs, more pain on the right side, more numbness on the left side, but feels the left side works better, the right side feels heavier-this is all chronic.  No falls.  He is gainfully employed full-time, his job is physically demanding, he operates a saw to cut cars.  Notes, numbness to both hands, preceding his transverse myelitis, over the last few years has worsened.  Notices when driving with hands on the steering wheel, will get numbness to both hands before arriving at his destination, improves with shaking the hands.  Also notes this, when operating the saw at work.  His transverse myelitis symptoms are overall stable.  He presents today for evaluation unaccompanied.  REVIEW OF SYSTEMS: Out of a complete 14 system review of symptoms, the patient complains only of the following symptoms, and all other reviewed systems are negative.  Spasticity  ALLERGIES: No Known Allergies  HOME MEDICATIONS: Outpatient Medications Prior to Visit  Medication Sig Dispense Refill   magnesium oxide (MAG-OX) 400 MG tablet Take 400 mg by mouth daily.     baclofen (LIORESAL) 20 MG tablet TAKE 1 TABLET BY MOUTH THREE TIMES DAILY 270 tablet 1   No facility-administered medications prior to visit.    PAST MEDICAL HISTORY: Past  Medical History:  Diagnosis Date   Bilateral carpal tunnel syndrome 11/05/2019   Neurogenic bladder 03/07/2016   Transverse myelitis (HCC)     PAST SURGICAL HISTORY: Past Surgical History:  Procedure Laterality Date   EPIDIDYMIS SURGERY  1987    FAMILY HISTORY: Family History  Problem Relation Age of Onset   Cancer Maternal Grandfather    Healthy Sister    Healthy Brother     SOCIAL HISTORY: Social History    Socioeconomic History   Marital status: Single    Spouse name: Not on file   Number of children: 1   Years of education: 12   Highest education level: Not on file  Occupational History   Occupation: Surveyor, mining  Tobacco Use   Smoking status: Every Day    Packs/day: 0.25    Pack years: 0.00    Types: Cigarettes   Smokeless tobacco: Never  Substance and Sexual Activity   Alcohol use: No    Alcohol/week: 0.0 standard drinks    Comment: Occasional   Drug use: No    Comment: Occasional marijuana   Sexual activity: Not on file  Other Topics Concern   Not on file  Social History Narrative   Lives at home w/ girlfriend, Efraim Kaufmann   Right-handed   Occasionally drinks 12 oz Pepsi   Social Determinants of Health   Financial Resource Strain: Not on file  Food Insecurity: Not on file  Transportation Needs: Not on file  Physical Activity: Not on file  Stress: Not on file  Social Connections: Not on file  Intimate Partner Violence: Not on file   PHYSICAL EXAM  Vitals:   10/01/20 0814  BP: 130/80  Pulse: (!) 54  Weight: 197 lb (89.4 kg)  Height: 5\' 6"  (1.676 m)    Body mass index is 31.8 kg/m.  Generalized: Well developed, in no acute distress   Neurological examination  Mentation: Alert oriented to time, place, history taking. Follows all commands speech and language fluent Cranial nerve II-XII: Pupils were equal round reactive to light. Extraocular movements were full, visual field were full on confrontational test. Facial sensation and strength were normal. Head turning and shoulder shrug  were normal and symmetric. Motor: The motor testing reveals 5 over 5 strength of all 4 extremities. Good symmetric motor tone is noted throughout.  Sensory: Sensory testing is intact to soft touch on all 4 extremities, but increased to the right lower extremity Coordination: Cerebellar testing reveals good finger-nose-finger and heel-to-shin bilaterally.  Gait and station: Gait  is normal. Tandem gait is normal.   DIAGNOSTIC DATA (LABS, IMAGING, TESTING) - I reviewed patient records, labs, notes, testing and imaging myself where available.  Lab Results  Component Value Date   WBC 5.4 08/19/2015   HGB 14.6 08/19/2015   HCT 42.6 08/19/2015   MCV 90.8 08/19/2015   PLT 230 08/19/2015      Component Value Date/Time   NA 142 08/19/2015 1056   K 4.0 08/19/2015 1056   CL 111 08/19/2015 1056   CO2 22 08/19/2015 1056   GLUCOSE 101 (H) 08/19/2015 1056   BUN 8 08/19/2015 1056   CREATININE 0.87 08/19/2015 1056   CALCIUM 9.3 08/19/2015 1056   PROT 6.7 08/19/2015 1056   ALBUMIN 4.1 08/19/2015 1056   AST 17 08/19/2015 1056   ALT 16 (L) 08/19/2015 1056   ALKPHOS 46 08/19/2015 1056   BILITOT 0.8 08/19/2015 1056   GFRNONAA >60 08/19/2015 1056   GFRAA >60 08/19/2015 1056   No  results found for: CHOL, HDL, LDLCALC, LDLDIRECT, TRIG, CHOLHDL No results found for: MLJQ4B Lab Results  Component Value Date   VITAMINB12 490 09/02/2015   No results found for: TSH  ASSESSMENT AND PLAN 45 y.o. year old male  has a past medical history of Bilateral carpal tunnel syndrome (11/05/2019), Neurogenic bladder (03/07/2016), and Transverse myelitis (HCC). here with:  1.  History of transverse myelitis 2.  Neurogenic bladder 3.  Bilateral carpal tunnel syndrome  -Remains stable, continues to benefit from baclofen, is gainfully employed full time, remains active -Continue baclofen 20 mg 3 times daily -Waiting to see hand surgeon for CTS, next year with new insurance, will let me know when ready, can assist with referral -Follow-up in 1 year or sooner if needed  Otila Kluver, DNP 10/01/2020, 8:34 AM [facility] [address], [address] [place], [place] [address] [phone]

## 2020-10-01 NOTE — Progress Notes (Signed)
I have read the note, and I agree with the clinical assessment and plan.  Wing Schoch K Adora Yeh   

## 2020-10-01 NOTE — Patient Instructions (Signed)
Continue Baclofen  Let me know when you are ready for hand surgeon referral  See you back in 1 year

## 2021-10-06 NOTE — Progress Notes (Signed)
PATIENT: Kenneth Garza DOB: [birth year]  REASON FOR VISIT: follow up for Transverse Myelitis  HISTORY FROM: patient PRIMARY NEUROLOGIST: [facility]  HISTORY OF PRESENT ILLNESS: Today 10/07/21 Kenneth Garza is here today for follow-up. Remains on Baclofen 20 mg 3 times daily. Without it feels stiffness in legs. Feels better with more movement. CTS is better, changed his job responsibilities, doesn't want to go through surgery. Bladder is better. Does have some constipation. When tired, hot, may have urinary accident. Feels symptoms worse on the right side. Had onset of symptoms in 2017. Doesn't exercise consistently. 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   Update 10/01/20 SS: Kenneth Garza is a 46 year old male with history of transverse myelitis.  On baclofen for lower extremity spasticity.  Has neurogenic bladder, cannot tolerate oxybutynin or Myrbetriq. Bladder is doing better, denies any incontinence. Had NCV/EMG that showed bilateral carpal tunnel worse on the right, was referred to a hand surgeon. Is waiting on this, until insurance changes.  His insurance will not pay for 90-day supply of baclofen.  He has noted if he misses a dose, he feels tightness in his lower extremities.  He continues to work full-time, has a Office manager, agrees with him better than his prior physical work. The heat does make his symptoms worse. Is overall pleased with his condition. Getting a new kayak that isn't so low. Here today unaccompanied.  REVIEW OF SYSTEMS: Out of a complete 14 system review of symptoms, the patient complains only of the following symptoms, and all other reviewed systems are negative.  See HPI  ALLERGIES: No Known Allergies  HOME MEDICATIONS: Outpatient Medications Prior to Visit  Medication Sig Dispense Refill   baclofen (LIORESAL) 20 MG tablet Take 1 tablet (20 mg total) by mouth 3 (three) times daily. 90 tablet 11   magnesium oxide (MAG-OX)  400 MG tablet Take 400 mg by mouth daily.     No facility-administered medications prior to visit.    PAST MEDICAL HISTORY: Past Medical History:  Diagnosis Date   Bilateral carpal tunnel syndrome 11/05/2019   Neurogenic bladder 03/07/2016   Transverse myelitis (HCC)     PAST SURGICAL HISTORY: Past Surgical History:  Procedure Laterality Date   EPIDIDYMIS SURGERY  1987    FAMILY HISTORY: Family History  Problem Relation Age of Onset   Cancer Maternal Grandfather    Healthy Sister    Healthy Brother     SOCIAL HISTORY: Social History   Socioeconomic History   Marital status: Single    Spouse name: Not on file   Number of children: 1   Years of education: 12   Highest education level: Not on file  Occupational History   Occupation: Surveyor, mining  Tobacco Use   Smoking status: Every Day    Packs/day: 0.25    Types: Cigarettes   Smokeless tobacco: Never  Substance and Sexual Activity   Alcohol use: No    Alcohol/week: 0.0 standard drinks of alcohol    Comment: Occasional   Drug use: No    Comment: Occasional marijuana   Sexual activity: Not on file  Other Topics Concern   Not on file  Social History Narrative   Lives at home w/ girlfriend, Efraim Kaufmann   Right-handed   Occasionally drinks 12 oz Pepsi   Social Determinants of Health   Financial Resource Strain: Not on file  Food Insecurity: Not on file  Transportation Needs: Not on file  Physical Activity: Not on file  Stress: Not on file  Social Connections: Not on file  Intimate Partner Violence: Not on file   PHYSICAL EXAM  Vitals:   10/07/21 0756  BP: (!) 144/89  Pulse: (!) 57  Weight: 189 lb 8 oz (86 kg)  Height: 5\' 6"  (1.676 m)   Body mass index is 30.59 kg/m.  Generalized: Well developed, in no acute distress   Neurological examination  Mentation: Alert oriented to time, place, history taking. Follows all commands speech and language fluent Cranial nerve II-XII: Pupils were equal round  reactive to light. Extraocular movements were full, visual field were full on confrontational test. Facial sensation and strength were normal. Head turning and shoulder shrug  were normal and symmetric. Motor: The motor testing reveals 5 over 5 strength of all 4 extremities. Good symmetric motor tone is noted throughout, slight increased in the right leg Sensory: Sensory testing is intact to soft touch on all 4 extremities, but increased to the right lower extremity Coordination: Cerebellar testing reveals good finger-nose-finger and heel-to-shin bilaterally.  Gait and station: Gait is normal. Tandem gait is normal.   DIAGNOSTIC DATA (LABS, IMAGING, TESTING) - I reviewed patient records, labs, notes, testing and imaging myself where available.  Lab Results  Component Value Date   WBC 5.4 08/19/2015   HGB 14.6 08/19/2015   HCT 42.6 08/19/2015   MCV 90.8 08/19/2015   PLT 230 08/19/2015      Component Value Date/Time   NA 142 08/19/2015 1056   K 4.0 08/19/2015 1056   CL 111 08/19/2015 1056   CO2 22 08/19/2015 1056   GLUCOSE 101 (H) 08/19/2015 1056   BUN 8 08/19/2015 1056   CREATININE 0.87 08/19/2015 1056   CALCIUM 9.3 08/19/2015 1056   PROT 6.7 08/19/2015 1056   ALBUMIN 4.1 08/19/2015 1056   AST 17 08/19/2015 1056   ALT 16 (L) 08/19/2015 1056   ALKPHOS 46 08/19/2015 1056   BILITOT 0.8 08/19/2015 1056   GFRNONAA >60 08/19/2015 1056   GFRAA >60 08/19/2015 1056   No results found for: "CHOL", "HDL", "LDLCALC", "LDLDIRECT", "TRIG", "CHOLHDL" No results found for: "HGBA1C" Lab Results  Component Value Date   VITAMINB12 490 09/02/2015   No results found for: "TSH"  ASSESSMENT AND PLAN 46 y.o. year old male  1.  History of transverse myelitis in 2017 2.  Neurogenic bladder 3.  Bilateral carpal tunnel syndrome  -His symptoms are overall improved, his job duties are more conducive now -Continue baclofen 20 mg 3 times daily -Encouraged to establish a consistent exercise,  stretching program, encouraged to establish with PCP, needs labs and physical, he deferred labs today -Follow-up in 1 year or sooner if needed, once established with PCP will transition to PRN at our office  2018, AGNP-C, DNP 10/07/2021, 8:27 AM [facility] [address] [place], [place] [phone]

## 2021-10-07 ENCOUNTER — Ambulatory Visit (INDEPENDENT_AMBULATORY_CARE_PROVIDER_SITE_OTHER): Payer: PRIVATE HEALTH INSURANCE | Admitting: Neurology

## 2021-10-07 ENCOUNTER — Encounter: Payer: Self-pay | Admitting: Neurology

## 2021-10-07 VITALS — BP 144/89 | HR 57 | Ht 66.0 in | Wt 189.5 lb

## 2021-10-07 DIAGNOSIS — G373 Acute transverse myelitis in demyelinating disease of central nervous system: Secondary | ICD-10-CM | POA: Diagnosis not present

## 2021-10-07 MED ORDER — BACLOFEN 20 MG PO TABS: 20.0000 mg | ORAL_TABLET | Freq: Three times a day (TID) | ORAL | 11 refills | Status: DC

## 2021-10-07 NOTE — Patient Instructions (Signed)
Please establish with primary care doctor, you need labs and physical  Meds ordered this encounter  Medications   baclofen (LIORESAL) 20 MG tablet    Sig: Take 1 tablet (20 mg total) by mouth 3 (three) times daily.    Dispense:  90 tablet    Refill:  11

## 2022-10-01 ENCOUNTER — Other Ambulatory Visit: Payer: Self-pay | Admitting: Neurology

## 2022-10-12 NOTE — Progress Notes (Deleted)
PATIENT: Kenneth Garza DOB: [birth year]  REASON FOR VISIT: follow up for Transverse Myelitis  HISTORY FROM: patient PRIMARY NEUROLOGIST: [facility]  HISTORY OF PRESENT ILLNESS: Today 10/12/22   10/07/21 SS: Kenneth Garza is here today for follow-up. Remains on Baclofen 20 mg 3 times daily. Without it feels stiffness in legs. Feels better with more movement. CTS is better, changed his job responsibilities, doesn't want to go through surgery. Bladder is better. Does have some constipation. When tired, hot, may have urinary accident. Feels symptoms worse on the right side. Had onset of symptoms in 2017. Doesn't exercise consistently. 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   Update 10/01/20 SS: Kenneth Garza is a 47 year old male with history of transverse myelitis.  On baclofen for lower extremity spasticity.  Has neurogenic bladder, cannot tolerate oxybutynin or Myrbetriq. Bladder is doing better, denies any incontinence. Had NCV/EMG that showed bilateral carpal tunnel worse on the right, was referred to a hand surgeon. Is waiting on this, until insurance changes.  His insurance will not pay for 90-day supply of baclofen.  He has noted if he misses a dose, he feels tightness in his lower extremities.  He continues to work full-time, has a Office manager, agrees with him better than his prior physical work. The heat does make his symptoms worse. Is overall pleased with his condition. Getting a new kayak that isn't so low. Here today unaccompanied.  REVIEW OF SYSTEMS: Out of a complete 14 system review of symptoms, the patient complains only of the following symptoms, and all other reviewed systems are negative.  See HPI  ALLERGIES: No Known Allergies  HOME MEDICATIONS: Outpatient Medications Prior to Visit  Medication Sig Dispense Refill   baclofen (LIORESAL) 20 MG tablet TAKE 1 TABLET BY MOUTH THREE TIMES DAILY 90 tablet 0   magnesium oxide (MAG-OX) 400  MG tablet Take 400 mg by mouth daily.     No facility-administered medications prior to visit.    PAST MEDICAL HISTORY: Past Medical History:  Diagnosis Date   Bilateral carpal tunnel syndrome 11/05/2019   Neurogenic bladder 03/07/2016   Transverse myelitis (HCC)     PAST SURGICAL HISTORY: Past Surgical History:  Procedure Laterality Date   EPIDIDYMIS SURGERY  1987    FAMILY HISTORY: Family History  Problem Relation Age of Onset   Cancer Maternal Grandfather    Healthy Sister    Healthy Brother     SOCIAL HISTORY: Social History   Socioeconomic History   Marital status: Single    Spouse name: Not on file   Number of children: 1   Years of education: 12   Highest education level: Not on file  Occupational History   Occupation: Surveyor, mining  Tobacco Use   Smoking status: Every Day    Packs/day: .25    Types: Cigarettes   Smokeless tobacco: Never  Substance and Sexual Activity   Alcohol use: No    Alcohol/week: 0.0 standard drinks of alcohol    Comment: Occasional   Drug use: No    Comment: Occasional marijuana   Sexual activity: Not on file  Other Topics Concern   Not on file  Social History Narrative   Lives at home w/ girlfriend, Efraim Kaufmann   Right-handed   Occasionally drinks 12 oz Pepsi   Social Determinants of Health   Financial Resource Strain: Not on file  Food Insecurity: Not on file  Transportation Needs: Not on file  Physical Activity: Not on file  Stress: Not on file  Social Connections: Not on file  Intimate Partner Violence: Not on file   PHYSICAL EXAM  There were no vitals filed for this visit.  There is no height or weight on file to calculate BMI.  Generalized: Well developed, in no acute distress   Neurological examination  Mentation: Alert oriented to time, place, history taking. Follows all commands speech and language fluent Cranial nerve II-XII: Pupils were equal round reactive to light. Extraocular movements were full,  visual field were full on confrontational test. Facial sensation and strength were normal. Head turning and shoulder shrug  were normal and symmetric. Motor: The motor testing reveals 5 over 5 strength of all 4 extremities. Good symmetric motor tone is noted throughout, slight increased in the right leg Sensory: Sensory testing is intact to soft touch on all 4 extremities, but increased to the right lower extremity Coordination: Cerebellar testing reveals good finger-nose-finger and heel-to-shin bilaterally.  Gait and station: Gait is normal. Tandem gait is normal.   DIAGNOSTIC DATA (LABS, IMAGING, TESTING) - I reviewed patient records, labs, notes, testing and imaging myself where available.  Lab Results  Component Value Date   WBC 5.4 08/19/2015   HGB 14.6 08/19/2015   HCT 42.6 08/19/2015   MCV 90.8 08/19/2015   PLT 230 08/19/2015      Component Value Date/Time   NA 142 08/19/2015 1056   K 4.0 08/19/2015 1056   CL 111 08/19/2015 1056   CO2 22 08/19/2015 1056   GLUCOSE 101 (H) 08/19/2015 1056   BUN 8 08/19/2015 1056   CREATININE 0.87 08/19/2015 1056   CALCIUM 9.3 08/19/2015 1056   PROT 6.7 08/19/2015 1056   ALBUMIN 4.1 08/19/2015 1056   AST 17 08/19/2015 1056   ALT 16 (L) 08/19/2015 1056   ALKPHOS 46 08/19/2015 1056   BILITOT 0.8 08/19/2015 1056   GFRNONAA >60 08/19/2015 1056   GFRAA >60 08/19/2015 1056   No results found for: "CHOL", "HDL", "LDLCALC", "LDLDIRECT", "TRIG", "CHOLHDL" No results found for: "HGBA1C" Lab Results  Component Value Date   VITAMINB12 490 09/02/2015   No results found for: "TSH"  ASSESSMENT AND PLAN 47 y.o. year old male  1.  History of transverse myelitis in 2017 2.  Neurogenic bladder 3.  Bilateral carpal tunnel syndrome  -His symptoms are overall improved, his job duties are more conducive now -Continue baclofen 20 mg 3 times daily -Encouraged to establish a consistent exercise, stretching program, encouraged to establish with PCP,  needs labs and physical, he deferred labs today -Follow-up in 1 year or sooner if needed, once established with PCP will transition to PRN at our office  Margie Ege, AGNP-C, DNP 10/12/2022, 3:30 PM [facility] [address] [place], [place] [address] [phone]

## 2022-10-13 ENCOUNTER — Encounter: Payer: Self-pay | Admitting: Neurology

## 2022-10-13 ENCOUNTER — Ambulatory Visit: Payer: PRIVATE HEALTH INSURANCE | Admitting: Neurology

## 2022-10-18 DIAGNOSIS — Z0289 Encounter for other administrative examinations: Secondary | ICD-10-CM

## 2022-10-31 ENCOUNTER — Other Ambulatory Visit: Payer: Self-pay | Admitting: Neurology

## 2022-12-11 ENCOUNTER — Other Ambulatory Visit: Payer: Self-pay | Admitting: Neurology

## 2022-12-28 ENCOUNTER — Ambulatory Visit (HOSPITAL_COMMUNITY)
Admission: EM | Admit: 2022-12-28 | Discharge: 2022-12-28 | Disposition: A | Discharge status: Home / Self Care | Payer: No Typology Code available for payment source | Attending: Family Medicine | Admitting: Family Medicine

## 2022-12-28 ENCOUNTER — Ambulatory Visit (INDEPENDENT_AMBULATORY_CARE_PROVIDER_SITE_OTHER): Payer: No Typology Code available for payment source

## 2022-12-28 ENCOUNTER — Encounter (HOSPITAL_COMMUNITY): Payer: Self-pay

## 2022-12-28 DIAGNOSIS — S92332A Displaced fracture of third metatarsal bone, left foot, initial encounter for closed fracture: Secondary | ICD-10-CM

## 2022-12-28 DIAGNOSIS — M79672 Pain in left foot: Secondary | ICD-10-CM | POA: Diagnosis not present

## 2022-12-28 MED ORDER — IBUPROFEN 800 MG PO TABS: 800.0000 mg | ORAL_TABLET | Freq: Three times a day (TID) | ORAL | 0 refills | Status: DC

## 2022-12-28 NOTE — ED Triage Notes (Signed)
Pt states that he injured his left foot. X2 days

## 2022-12-28 NOTE — ED Provider Notes (Signed)
[facility]   [id number] 12/28/22 Arrival Time: 1220  ASSESSMENT & PLAN:  1. Left foot pain   2. Closed displaced fracture of third metatarsal bone of left foot, initial encounter     I have personally viewed and independently interpreted the imaging studies ordered this visit. L foot: displaced mid 3rd metatarsal fracture.  Orders Placed This Encounter  Procedures   Apply splint short leg    Standing Status:   Standing    Number of Occurrences:   1    Order Specific Question:   Laterality    Answer:   Left    Order Specific Question:   Type:    Answer:   Posterior   Crutches    Standing Status:   Standing    Number of Occurrences:   1   To remain non weight-bearing until orthopaedic follow up. Work note provided. He is unclear whether this will eventually be a worker's comp case.  As needed: New Prescriptions   IBUPROFEN (ADVIL) 800 MG TABLET    Take 1 tablet (800 mg total) by mouth 3 (three) times daily with meals.    Orders Placed This Encounter  Procedures   DG Foot Complete Left   Apply splint short leg   Crutches   Recommend:  Follow-up Information     Call  [facility].   Why: [address] [place], [place] [address]  [phone]        Schedule an appointment as soon as possible for a visit  with [facility]).   Contact information: [address] [place],  [place]  [address]  [phone]               Reviewed expectations re: course of current medical issues. Questions answered. Outlined signs and symptoms indicating need for more acute intervention. Patient verbalized understanding. After Visit Summary given.  SUBJECTIVE: History from: patient.  Kenneth Garza is a 47 y.o. male who reports persistent pain of dorsal LEFT foot; today; reports fork of forklift fell onto his foot. Immediate pain. Can bear weight but is painful. No extremity sensation changes or  weakness from baseline (has transverse myelitis). No tx PTA.  Past Surgical History:  Procedure Laterality Date   EPIDIDYMIS SURGERY  1987      OBJECTIVE:  Vitals:   12/28/22 1323 12/28/22 1324  BP:  (!) 157/85  Pulse:  (!) 58  Resp:  17  Temp:  98.5 F (36.9 C)  TempSrc:  Oral  SpO2:  97%  Weight: 86.2 kg   Height: 5\' 6"  (1.676 m)     General appearance: alert; no distress HEENT: [place]; AT Neck: supple with FROM Resp: unlabored respirations Extremities: LLE: warm with well perfused appearance; fairly well localized marked tenderness over left dorsal midfoot; without gross deformities; swelling: moderate; bruising: diffuse distally; ankle and all toes with normal ROM CV: brisk extremity capillary refill of LLE; 2+ DP pulse of LLE. Skin: warm and dry; no visible rashes Neurologic: gait normal; normal sensation and strength of LLE Psychological: alert and cooperative; normal mood and affect  Imaging: No results found.    No Known Allergies  Past Medical History:  Diagnosis Date   Bilateral carpal tunnel syndrome 11/05/2019   Neurogenic bladder 03/07/2016   Transverse myelitis (HCC)    Social History   Socioeconomic History   Marital status: Single    Spouse name: Not on file   Number of children: 1  Years of education: 80   Highest education level: Not on file  Occupational History   Occupation: Surveyor, mining  Tobacco Use   Smoking status: Former    Current packs/day: 0.25    Types: Cigarettes   Smokeless tobacco: Never  Vaping Use   Vaping status: Every Day  Substance and Sexual Activity   Alcohol use: No    Alcohol/week: 0.0 standard drinks of alcohol    Comment: Occasional   Drug use: No    Comment: Occasional marijuana   Sexual activity: Not on file  Other Topics Concern   Not on file  Social History Narrative   Lives at home w/ girlfriend, Efraim Kaufmann   Right-handed   Occasionally drinks 12 oz Pepsi   Social Determinants of Health    Financial Resource Strain: Not on file  Food Insecurity: Not on file  Transportation Needs: Not on file  Physical Activity: Not on file  Stress: Not on file  Social Connections: Not on file   Family History  Problem Relation Age of Onset   Cancer Maternal Grandfather    Healthy Sister    Healthy Brother    Past Surgical History:  Procedure Laterality Date   EPIDIDYMIS SURGERY  1987

## 2022-12-28 NOTE — ED Notes (Signed)
Ortho tech called states they will be down in a few minutes to apply splint.

## 2023-01-09 ENCOUNTER — Other Ambulatory Visit: Payer: Self-pay | Admitting: Neurology

## 2023-01-10 ENCOUNTER — Encounter: Payer: Self-pay | Admitting: Podiatry

## 2023-01-10 ENCOUNTER — Ambulatory Visit (INDEPENDENT_AMBULATORY_CARE_PROVIDER_SITE_OTHER): Payer: No Typology Code available for payment source

## 2023-01-10 ENCOUNTER — Telehealth: Payer: Self-pay | Admitting: Podiatry

## 2023-01-10 ENCOUNTER — Ambulatory Visit (INDEPENDENT_AMBULATORY_CARE_PROVIDER_SITE_OTHER): Payer: No Typology Code available for payment source | Admitting: Podiatry

## 2023-01-10 VITALS — BP 160/90 | HR 66 | Temp 97.5°F | Resp 18 | Ht 66.0 in | Wt 190.0 lb

## 2023-01-10 DIAGNOSIS — S92335A Nondisplaced fracture of third metatarsal bone, left foot, initial encounter for closed fracture: Secondary | ICD-10-CM | POA: Insufficient documentation

## 2023-01-10 DIAGNOSIS — R601 Generalized edema: Secondary | ICD-10-CM | POA: Diagnosis not present

## 2023-01-10 DIAGNOSIS — S92505A Nondisplaced unspecified fracture of left lesser toe(s), initial encounter for closed fracture: Secondary | ICD-10-CM | POA: Diagnosis not present

## 2023-01-10 NOTE — Addendum Note (Signed)
Addended byLucia Estelle D on: 01/10/2023 07:21 PM   Modules accepted: Orders

## 2023-01-10 NOTE — Progress Notes (Signed)
Chief Complaint  Patient presents with   Toe Pain    Left foot broken toe   HPI: 47 y.o. male presents today following an injury which occurred to his left foot on December 27, 2022.  He was at work when a forklift fell onto the top of his left foot.  He states that he was wearing steel toe shoe gear at the time but the forklift fell just behind where the steel toe portion ended.  He was seen at an urgent care facility and placed in a soft cast and has been partial weightbearing with crutches.  Notes that he has had continued pain and swelling since the injury.  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  There are x-rays on file from urgent care to review today from 2 weeks ago.  There was a fracture to the third metatarsal.  He states that he has been going to work but it has been painful and cumbersome trying to get around with the crutches.  He notes that the foot continues to stay swollen.  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  Denies any lacerations to the foot at the time of injury  Past Medical History:  Diagnosis Date   Bilateral carpal tunnel syndrome 11/05/2019   Neurogenic bladder 03/07/2016   Transverse myelitis [facility])    Past Surgical History:  Procedure Laterality Date   EPIDIDYMIS SURGERY  1987   No Known Allergies  Review of Systems  Musculoskeletal:        Pain and swelling of forefoot, left foot.    Physical Exam: Vitals:   01/10/23 0857 01/10/23 0920  BP: (!) 160/90 (!) 160/90  Pulse:  66  Resp:  18  Temp:  (!) 97.5 F (36.4 C)  SpO2:  96%   General: The patient is alert and oriented x3 in no acute distress.  Dermatology: Skin is warm, dry and supple bilateral lower extremities. Interspaces are clear of maceration and debris.  The left foot is edematous to the entire midfoot  and forefoot.  There is some ecchymosis noted to the forefoot and first and second toes.  No open lesions noted.  Vascular: Palpable pedal pulses bilaterally. Capillary refill within normal limits.  Proximal to distal cooling is within normal limits.  No rubor noted  Neurological: Light touch sensation diminished left dorsal forefoot.  Protective sensation is intact with a Semmes Weinstein monofilament to the left toes.  Musculoskeletal Exam: Pain on palpation to the dorsal aspect of the left forefoot most notably along the third metatarsal shaft area.  Radiographic Exam (left foot, 3 views, 01/10/23):  Normal osseous mineralization.  There is dorsal and lateral displacement of the distal fracture fragment at the fracture site, which is midshaft of the third metatarsal.  Fracture gap is less then 1 cm.  The distal segment of the metatarsal is angulated slightly medially.  No other fractures are noted.  These images were compared to his left foot x-rays which were obtained on 12/28/2022 which showed no significant changes in the fracture presentation.  Assessment/Plan of Care: 1. Closed nondisplaced fracture of third metatarsal bone of left foot, initial encounter   2. Generalized edema     Time was spent today reviewing the urgent care physician's notes and radiographs  from 12/28/2022 visit.  Discussed clinical and today's radiographic findings with patient.  He needs to be better immobilized due to the midshaft metatarsal fracture.  He was fitted for a pneumatic cam walker which was sized to fit his left foot and leg today.  He will wear this at all times, even with sleeping, with the exception of bathing.  He needs to be minimal to no weightbearing and would benefit tremendously from either a knee scooter or a rollabout walker device.  The crutches he currently is using pose too much of a fall risk especially due to his transverse myelitis comorbidity.  He could be at increased risk for falls due to  balance issues.  He needs to stay off the foot and keep it elevated is much as possible.  He may ice the area 2 or 3 times a day by opening the Velcro section of the boot and placing an ice pack on for 15 to 20 minutes at a time.  Will check into a bone growth stimulator to see if we can facilitate healing of the fracture so that he may recover and return to work sooner.  We will contact [employer], a company that provides external bone stimulators, and relay patient information to check benefits and coverage for the device.  Patient was provided completed employer paperwork to be out of work until 02/13/2023.  This work note may need to be modified depending on the next clinical exam and how his follow-up x-rays look at his next visit on 02/01/2023.  Will follow-up with patient at our [facility] facility for his next visit.  Patient does not feel that he needs any medication for pain management at his time because his transverse myelitis causes some numbness in his feet.   Clerance Lav, DPM, FACFAS [facility]     [address] [place], [place] [address]                Office [phone]  Fax [phone]

## 2023-01-10 NOTE — Telephone Encounter (Signed)
Called patient, but no answer, to inform him I contacted our external bone growth stimulator rep., who thinks he may be a candidate for immediate bone stim use since this is a worker's comp case, to help with bone healing.  Rep may be in contact with him, so I wanted to give him a heads up to expect a call from Cammie Mcgee at [employer]

## 2023-01-11 ENCOUNTER — Ambulatory Visit (INDEPENDENT_AMBULATORY_CARE_PROVIDER_SITE_OTHER): Payer: No Typology Code available for payment source | Admitting: Neurology

## 2023-01-11 ENCOUNTER — Encounter: Payer: Self-pay | Admitting: Neurology

## 2023-01-11 VITALS — BP 155/93 | HR 77 | Resp 15 | Ht 66.0 in

## 2023-01-11 DIAGNOSIS — N319 Neuromuscular dysfunction of bladder, unspecified: Secondary | ICD-10-CM | POA: Diagnosis not present

## 2023-01-11 DIAGNOSIS — G373 Acute transverse myelitis in demyelinating disease of central nervous system: Secondary | ICD-10-CM

## 2023-01-11 DIAGNOSIS — G5603 Carpal tunnel syndrome, bilateral upper limbs: Secondary | ICD-10-CM

## 2023-01-11 MED ORDER — BACLOFEN 20 MG PO TABS: 20.0000 mg | ORAL_TABLET | Freq: Three times a day (TID) | ORAL | 11 refills | Status: DC

## 2023-01-11 NOTE — Progress Notes (Signed)
PATIENT: Kenneth Garza DOB: [birth year]  REASON FOR VISIT: follow up for Transverse Myelitis  HISTORY FROM: patient PRIMARY NEUROLOGIST: Willis/Sater  HISTORY OF PRESENT ILLNESS: Today 01/11/23 Fracture left 3rd metatarsal, out of work until end of October, in a boot. Remains on baclofen 20 mg 3 times daily, when he misses it, he has nausea, tightness in his back and legs. Few times prior to foot injury, he would trip moving too fast. CTS is doing good. Bladder control is better, only 1 incontinent episode, does have more constipation, remains active, fishing, hunting. Lives with his significant other. Health has been good. Getting ready to get established at [facility]. Feels has remained stable.   10/07/21 SS: Kenneth Garza is here today for follow-up. Remains on Baclofen 20 mg 3 times daily. Without it feels stiffness in legs. Feels better with more movement. CTS is better, changed his job responsibilities, doesn't want to go through surgery. Bladder is better. Does have some constipation. When tired, hot, may have urinary accident. Feels symptoms worse on the right side. Had onset of symptoms in 2017. Doesn't exercise consistently. 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   Update 10/01/20 SS: Kenneth Garza is a 47 year old male with history of transverse myelitis.  On baclofen for lower extremity spasticity.  Has neurogenic bladder, cannot tolerate oxybutynin or Myrbetriq. Bladder is doing better, denies any incontinence. Had NCV/EMG that showed bilateral carpal tunnel worse on the right, was referred to a hand surgeon. Is waiting on this, until insurance changes.  His insurance will not pay for 90-day supply of baclofen.  He has noted if he misses a dose, he feels tightness in his lower extremities.  He continues to work full-time, has a Office manager, agrees with him better than his prior physical work. The heat does make his symptoms worse. Is overall pleased  with his condition. Getting a new kayak that isn't so low. Here today unaccompanied.  REVIEW OF SYSTEMS: Out of a complete 14 system review of symptoms, the patient complains only of the following symptoms, and all other reviewed systems are negative.  See HPI  ALLERGIES: No Known Allergies  HOME MEDICATIONS: Outpatient Medications Prior to Visit  Medication Sig Dispense Refill   baclofen (LIORESAL) 20 MG tablet TAKE 1 TABLET BY MOUTH THREE TIMES DAILY 90 tablet 0   magnesium oxide (MAG-OX) 400 MG tablet Take 400 mg by mouth daily.     ibuprofen (ADVIL) 800 MG tablet Take 1 tablet (800 mg total) by mouth 3 (three) times daily with meals. 21 tablet 0   No facility-administered medications prior to visit.    PAST MEDICAL HISTORY: Past Medical History:  Diagnosis Date   Bilateral carpal tunnel syndrome 11/05/2019   Neurogenic bladder 03/07/2016   Transverse myelitis (HCC)     PAST SURGICAL HISTORY: Past Surgical History:  Procedure Laterality Date   EPIDIDYMIS SURGERY  1987    FAMILY HISTORY: Family History  Problem Relation Age of Onset   Cancer Maternal Grandfather    Healthy Sister    Healthy Brother     SOCIAL HISTORY: Social History   Socioeconomic History   Marital status: Single    Spouse name: Not on file   Number of children: 1   Years of education: 12   Highest education level: Not on file  Occupational History   Occupation: Surveyor, mining  Tobacco Use   Smoking status: Former    Current packs/day: 0.25    Types:  Cigarettes   Smokeless tobacco: Never  Vaping Use   Vaping status: Every Day   Substances: Nicotine, Flavoring  Substance and Sexual Activity   Alcohol use: No    Alcohol/week: 0.0 standard drinks of alcohol    Comment: Occasional   Drug use: No    Comment: Occasional marijuana   Sexual activity: Yes    Birth control/protection: None  Other Topics Concern   Not on file  Social History Narrative   Lives at home w/ girlfriend,  Efraim Kaufmann   Right-handed   Occasionally drinks 12 oz Pepsi   Social Determinants of Health   Financial Resource Strain: Not on file  Food Insecurity: Not on file  Transportation Needs: Not on file  Physical Activity: Not on file  Stress: Not on file  Social Connections: Not on file  Intimate Partner Violence: Not on file   PHYSICAL EXAM  Vitals:   01/11/23 1403 01/11/23 1406  BP: (!) 182/98 (!) 155/93  Pulse: 77   Resp: 15   Height: 5\' 6"  (1.676 m)    Body mass index is 30.67 kg/m.  Generalized: Well developed, in no acute distress   Neurological examination  Mentation: Alert oriented to time, place, history taking. Follows all commands speech and language fluent Cranial nerve II-XII: Pupils were equal round reactive to light. Extraocular movements were full, visual field were full on confrontational test. Facial sensation and strength were normal. Head turning and shoulder shrug  were normal and symmetric. Motor: The motor testing reveals 5 over 5 strength of all 4 extremities. Good symmetric motor tone is noted throughout, slight increased in the right leg, wearing boot to left foot Sensory: Sensory testing is intact to soft touch on all 4 extremities, but increased to the right lower extremity Coordination: Cerebellar testing reveals good finger-nose-finger and heel-to-shin bilaterally.  Gait and station: Gait is normal with left foot boot  DIAGNOSTIC DATA (LABS, IMAGING, TESTING) - I reviewed patient records, labs, notes, testing and imaging myself where available.  Lab Results  Component Value Date   WBC 5.4 08/19/2015   HGB 14.6 08/19/2015   HCT 42.6 08/19/2015   MCV 90.8 08/19/2015   PLT 230 08/19/2015      Component Value Date/Time   NA 142 08/19/2015 1056   K 4.0 08/19/2015 1056   CL 111 08/19/2015 1056   CO2 22 08/19/2015 1056   GLUCOSE 101 (H) 08/19/2015 1056   BUN 8 08/19/2015 1056   CREATININE 0.87 08/19/2015 1056   CALCIUM 9.3 08/19/2015 1056    PROT 6.7 08/19/2015 1056   ALBUMIN 4.1 08/19/2015 1056   AST 17 08/19/2015 1056   ALT 16 (L) 08/19/2015 1056   ALKPHOS 46 08/19/2015 1056   BILITOT 0.8 08/19/2015 1056   GFRNONAA >60 08/19/2015 1056   GFRAA >60 08/19/2015 1056   No results found for: "CHOL", "HDL", "LDLCALC", "LDLDIRECT", "TRIG", "CHOLHDL" No results found for: "HGBA1C" Lab Results  Component Value Date   VITAMINB12 490 09/02/2015   No results found for: "TSH"  ASSESSMENT AND PLAN 47 y.o. year old male  1.  History of transverse myelitis in 2017 2.  Neurogenic bladder 3.  Bilateral carpal tunnel syndrome  -Has remained overall stable, we will continue baclofen 20 mg 3 times daily for spasticity -Encouraged to establish a consistent exercise, stretching program -Seeing new PCP next week, needs labs, physical, BP is elevated -Follow-up in 1 year, will see Dr. Epimenio Foot for 1 visit, I can continue to follow if needed or transition  back to PCP  Margie Ege, AGNP-C, DNP 01/11/2023, 2:10 PM [facility] [address] [place], [place] [address] [phone]

## 2023-02-01 ENCOUNTER — Encounter: Payer: Self-pay | Admitting: Podiatry

## 2023-02-01 ENCOUNTER — Ambulatory Visit: Payer: No Typology Code available for payment source | Admitting: Podiatry

## 2023-02-01 ENCOUNTER — Ambulatory Visit (INDEPENDENT_AMBULATORY_CARE_PROVIDER_SITE_OTHER): Payer: No Typology Code available for payment source

## 2023-02-01 DIAGNOSIS — S92335D Nondisplaced fracture of third metatarsal bone, left foot, subsequent encounter for fracture with routine healing: Secondary | ICD-10-CM

## 2023-02-01 DIAGNOSIS — S92335A Nondisplaced fracture of third metatarsal bone, left foot, initial encounter for closed fracture: Secondary | ICD-10-CM | POA: Diagnosis not present

## 2023-02-01 NOTE — Progress Notes (Unsigned)
  Chief Complaint  Patient presents with   Fracture    3rd metatarsal fracture follow up, states he hasn't received bone stimulator or knee scooter. He presents in cam boot otherwise unassisted    HPI: 47 y.o. male presents today for follow-up of left third metatarsal shaft fracture.  Soon after his last appointment we had contacted an external bone stimulator representative who was going to reach out to his Worker's Comp. insurance and see about getting him set up with a bone stimulator to facilitate healing and possible earlier return to work.  Patient states that he has not received any bone stimulator to date.  He also noted that his Worker's Comp. representative was supposed to also meet him here for his appointment today, but he is not.  He notes that he still has pain to the area but acknowledges the swelling has gone down.  His cam walker shows signs of wear.  Past Medical History:  Diagnosis Date   Bilateral carpal tunnel syndrome 11/05/2019   Neurogenic bladder 03/07/2016   Transverse myelitis (HCC)    Past Surgical History:  Procedure Laterality Date   EPIDIDYMIS SURGERY  1987   No Known Allergies   Physical Exam: Palpable pedal pulses left foot.  Minimal to no edema to the left foot.  There remains pain on palpation to the third metatarsal.  No significant change in appearance other than the improving edema is noted today.  No open lesions are seen.  Radiographic Exam (left foot, 3 weightbearing views, 02/01/2023):  Normal osseous mineralization.  Fracture line is seen at the third metatarsal shaft.  There is moderate bone callus formation present indicating good signs of healing.  The fracture is in good to fair position.    Assessment/Plan of Care: 1. Closed nondisplaced fracture of third metatarsal bone of left foot with routine healing, subsequent encounter     Discussed clinical and radiographic findings with patient today.  Will try to reach out to the bone stimulator  representative to get a status update on this patient being able to obtain the bone stimulator.  Patient will need to remain in the cam walker with minimal weightbearing for another few weeks until this is fully healed.  Will reschedule the patient in 2 to 3 weeks for new x-ray.  Will keep him out of work until that time.  May need to reassess return to work status at that appointment.   Clerance Lav, DPM, FACFAS [facility]     [address] [place], [place] [address]                Office [phone]  Fax [phone]

## 2023-02-02 ENCOUNTER — Telehealth: Payer: Self-pay

## 2023-02-02 NOTE — Telephone Encounter (Signed)
Received a text this morning from Cammie Mcgee, rep for [employer].Patient has been approved for bone stimulator. He will be shipping the patient's unit tonight and will be able to do the fitting early next week. If you have questions for Joey, you can call or text [phone] Thanks

## 2023-02-15 ENCOUNTER — Ambulatory Visit: Payer: No Typology Code available for payment source | Admitting: Podiatry

## 2023-02-15 ENCOUNTER — Encounter: Payer: Self-pay | Admitting: Podiatry

## 2023-02-20 ENCOUNTER — Telehealth: Payer: Self-pay | Admitting: Neurology

## 2023-02-20 NOTE — Telephone Encounter (Signed)
LVM and sent mychart msg informing pt of need to reschedule 01/11/24 appt - MD out

## 2023-02-21 ENCOUNTER — Ambulatory Visit: Payer: PRIVATE HEALTH INSURANCE | Admitting: Neurology

## 2023-02-24 ENCOUNTER — Ambulatory Visit (INDEPENDENT_AMBULATORY_CARE_PROVIDER_SITE_OTHER): Payer: Worker's Compensation | Admitting: Podiatry

## 2023-02-24 ENCOUNTER — Encounter: Payer: Self-pay | Admitting: Podiatry

## 2023-02-24 ENCOUNTER — Ambulatory Visit (INDEPENDENT_AMBULATORY_CARE_PROVIDER_SITE_OTHER): Payer: Worker's Compensation

## 2023-02-24 DIAGNOSIS — M778 Other enthesopathies, not elsewhere classified: Secondary | ICD-10-CM

## 2023-02-24 DIAGNOSIS — S92335D Nondisplaced fracture of third metatarsal bone, left foot, subsequent encounter for fracture with routine healing: Secondary | ICD-10-CM | POA: Diagnosis not present

## 2023-02-24 NOTE — Progress Notes (Unsigned)
   Chief Complaint  Patient presents with   Follow-up    2 week follow up for l. Foot fx. Some improvement in pain. External stimulator does seem to be helping. Tightness in lower calf. Using a wheeled walker to help ambulate at times. 2/10 pain right now. 6/10 when it is at it's worst.    HPI: 47 y.o. male presents today for follow-up of left third metatarsal fracture.  Once again, his Worker's Comp. case reviewer did not show for his appointment today.  We did wait a few minutes to see if they would eventually arrive but they did not.  He notes that he is having less and less pain to the area.  He has been wearing the pneumatic cam walker as instructed at all times weightbearing.  He is using the external bone stimulator daily.  Past Medical History:  Diagnosis Date   Bilateral carpal tunnel syndrome 11/05/2019   Neurogenic bladder 03/07/2016   Transverse myelitis (HCC)     Past Surgical History:  Procedure Laterality Date   EPIDIDYMIS SURGERY  1987   No Known Allergies   Physical Exam: No edema is noted to the left foot.  There is minimal pain on palpation of the third metatarsal in the area of the fracture.  No pain with range of motion of the toes.  No pain with pressure to the metatarsal heads.  There are palpable pedal pulses left foot  Radiographic Exam (left foot, 3 weightbearing views, 02/24/2023):  Normal osseous mineralization. Joint spaces preserved.  The fracture is still maintained in the same position.  This is good/fair position.  There is moderate bone callus present which is an improvement since last visit.  Good evidence of bone healing noted.  Assessment/Plan of Care: 1. Closed nondisplaced fracture of third metatarsal bone of left foot with routine healing, subsequent encounter   2. Capsulitis of foot     Discussed clinical and radiographic findings with patient today.  Due to the excellent amount of bone callus formation present and decrease in pain, we will  switch the patient to a surgical shoe for the next 2 weeks.  Towards the latter half of the second week he will slowly begin progressing into a regular shoe or boot as tolerated.  Will plan for patient to return to work in 2 weeks time unless any type of new injury during his recovery period occurs.  Over the next few weeks he was instructed to continue wearing the external bone stimulator.  He may be able to sleep with this on once he returns to work just to ensure excellent bone healing.  Clerance Lav, DPM, FACFAS [facility]     [address] [place], [place] [address]                Office [phone]  Fax [phone]

## 2023-03-09 ENCOUNTER — Ambulatory Visit (INDEPENDENT_AMBULATORY_CARE_PROVIDER_SITE_OTHER): Payer: Worker's Compensation | Admitting: Podiatry

## 2023-03-09 ENCOUNTER — Ambulatory Visit (INDEPENDENT_AMBULATORY_CARE_PROVIDER_SITE_OTHER): Payer: Worker's Compensation

## 2023-03-09 ENCOUNTER — Encounter: Payer: Self-pay | Admitting: Podiatry

## 2023-03-09 DIAGNOSIS — M778 Other enthesopathies, not elsewhere classified: Secondary | ICD-10-CM

## 2023-03-09 DIAGNOSIS — R2689 Other abnormalities of gait and mobility: Secondary | ICD-10-CM

## 2023-03-09 DIAGNOSIS — S92335D Nondisplaced fracture of third metatarsal bone, left foot, subsequent encounter for fracture with routine healing: Secondary | ICD-10-CM

## 2023-03-09 NOTE — Progress Notes (Signed)
Chief Complaint  Patient presents with   Fracture    Left foot. Some improvement. Wore a croc shoe for entire day a couple of days ago and it caused increased soreness the next day. C/o pain at left 5th metatarsal. Unsure if it was from standing too long or pressure. Has not noticed any swelling but has noticed some bruising.    HPI: 47 y.o. male presents today for follow-up of left third metatarsal fracture.  Patient is noticeably limping more today than on last visit.  Patient's case manager for his Worker's Comp. claim is present for today's visit.  Her name is Eugenia Mcalpine.  She is present during the exam.  Patient states that he tried wearing a regular shoe for a full day couple days ago but then had moderate pain and swelling after this.  He was asked if he had been slowly weaning back into regular shoes rather than simply going a full day and he states that he had been.  The patient is wearing the external bone stimulator daily.  Past Medical History:  Diagnosis Date   Bilateral carpal tunnel syndrome 11/05/2019   Neurogenic bladder 03/07/2016   Transverse myelitis (HCC)     Past Surgical History:  Procedure Laterality Date   EPIDIDYMIS SURGERY  1987   No Known Allergies   Physical Exam: Palpable pedal pulses left foot.  No edema is noted.  No ecchymosis is noted.  Pain on palpation, however milder than previous visits, along left third metatarsal shaft.  Patient is able to actively perform range of motion of the toes albeit somewhat limited secondary to pain and stiffness  Radiographic Exam (left foot, 3 weightbearing views, 03/09/2023):  Normal osseous mineralization.  There is continued bone callus formation noted.  The fracture position is fair to good.  It appears to be healing well.  No new fracture is seen.  No change in position or angulation of the fracture fragments are noted.  Assessment/Plan of Care: 1. Closed nondisplaced fracture of third metatarsal bone of  left foot with routine healing, subsequent encounter   2. Capsulitis of foot   3. Antalgic gait     AMB REFERRAL TO PHYSICAL THERAPY  Discussed clinical and radiographic findings with patient and his caseworker today.  His caseworker requested all notes today.  She was advised to speak to our front desk about this and make this request at checkout.  Extra time was spent with the caseworker during the patient's exam bringing her up to speed on this patient's diagnosis and progress since no one had been present for that his past 2 appointments from [employer].  Patient should be able to return to light/desk duty for 4 hours a day where he does not need to stand or walk for any prolonged length of time.  Will order physical therapy to assist the patient's return to work.  Will keep him at light duty for a few more weeks until we receive the follow-up reports from physical therapy to assess their determination on his ability to increase work duties or time at work.  Reassess in approximately 1 month.   Clerance Lav, DPM, FACFAS [facility]     [address], [place] [address]  Office [phone]  Fax [phone]

## 2023-03-10 ENCOUNTER — Ambulatory Visit: Payer: No Typology Code available for payment source | Admitting: Podiatry

## 2023-03-20 ENCOUNTER — Telehealth: Payer: Self-pay | Admitting: Podiatry

## 2023-03-20 NOTE — Telephone Encounter (Signed)
Patient called and said he left a note for Dr. Burna Mortimer to fill out last week. He would like to know when it is going to be done because he has not gotten a call back. He would like for it to be emailed to him; the email we have on file is correct

## 2023-03-22 ENCOUNTER — Encounter: Payer: Self-pay | Admitting: Podiatry

## 2023-04-06 ENCOUNTER — Ambulatory Visit (INDEPENDENT_AMBULATORY_CARE_PROVIDER_SITE_OTHER): Payer: Worker's Compensation | Admitting: Podiatry

## 2023-04-06 ENCOUNTER — Ambulatory Visit (INDEPENDENT_AMBULATORY_CARE_PROVIDER_SITE_OTHER): Payer: Worker's Compensation

## 2023-04-06 ENCOUNTER — Encounter: Payer: Self-pay | Admitting: Podiatry

## 2023-04-06 DIAGNOSIS — M778 Other enthesopathies, not elsewhere classified: Secondary | ICD-10-CM

## 2023-04-06 DIAGNOSIS — S92335D Nondisplaced fracture of third metatarsal bone, left foot, subsequent encounter for fracture with routine healing: Secondary | ICD-10-CM | POA: Diagnosis not present

## 2023-04-09 ENCOUNTER — Encounter: Payer: Self-pay | Admitting: Podiatry

## 2023-04-09 NOTE — Progress Notes (Signed)
Chief Complaint  Patient presents with   Follow-up    Working 4 hours a day now. Doing okay with this. Does have some pain and pressure in the LT 1st and plantar area. Reports 6/10 pain with stepping down, however this is not constant.    HPI: 47 y.o. male presents today for follow-up of a close metatarsal fracture of the left third metatarsal.  His Worker's Comp. representative is also here and present for his exam.  She stated that his employer does not have any type of work for the current restrictions for only 4 hours a day.  They are wondering when he is able to return to work without restrictions.  He is still using the external bone stimulator at home on a daily basis.  He just started physical therapy but has not had many visits thus far.  He does note pain in the foot toward the end of the day with prolonged standing.  He is still concerned about working a full day without pain or limited mobility.  Past Medical History:  Diagnosis Date   Bilateral carpal tunnel syndrome 11/05/2019   Neurogenic bladder 03/07/2016   Transverse myelitis (HCC)     Past Surgical History:  Procedure Laterality Date   EPIDIDYMIS SURGERY  1987   No Known Allergies   Physical Exam: Palpable pedal pulses.  Minimal to no localized edema near the second third and fourth MPJs.  The patient felt that he saw ecchymosis to the dorsal foot but this is hyperpigmentation from his healing and swelling.  There is discomfort to the plantar aspect of the first MPJ.  No pain on palpation to the third metatarsal shaft.  Some limited range of motion at the second and third MPJ.  There is no crepitus  Radiographic Exam (left foot, 3 weightbearing views, 04/06/2023):  Normal osseous mineralization.  There is adequate bone callus formation bridging the fracture gap.  Radiographically this is healed.  This is in fair to good position.  No other fracture or periosteal reactions are noted.  Assessment/Plan of Care: 1.  Closed nondisplaced fracture of third metatarsal bone of left foot with routine healing, subsequent encounter   2. Capsulitis of left foot     Discussed clinical and radiographic findings with patient today.  The Worker's Comp. representative once again requested specific medical records from me during the examination.  She was directed to speak to our front desk receptionist with regard to medical records request.  Discussed the patient's return to work status.  Will have him tentatively scheduled to return to work within the next couple weeks.  We need to have some more physical therapy visits under his belt before he feels comfortable standing all day.  Patient will continue with the external bone stimulator at home as well.  If he is having any continued issues with the left foot, he can call the office for an appointment.  At this time we will not schedule follow-up.  Will leave things up to physical therapy to assess his return to work based on his progress at his upcoming visits.   Clerance Lav, DPM, FACFAS [facility]     [address], [place] [address]  Office [phone]  Fax [phone]

## 2023-04-18 ENCOUNTER — Ambulatory Visit: Payer: PRIVATE HEALTH INSURANCE | Admitting: Neurology

## 2023-06-08 ENCOUNTER — Telehealth: Payer: Self-pay | Admitting: Podiatry

## 2023-06-08 NOTE — Telephone Encounter (Signed)
 Ms. Carrier is covering case manger for Pt and is following up on forms sent over [employer] regarding appointment on Dec 19,2024. Wants to know if it was received and completed. Please fax to [phone]. She will be re faxing a copy today to [phone]

## 2023-06-13 ENCOUNTER — Telehealth: Payer: Self-pay | Admitting: Podiatry

## 2023-06-13 NOTE — Telephone Encounter (Signed)
 Completed Workman's Comp paperwork from [employer] for the patient ....  Faxed to [employer] @ [phone] ....  J. Abbott (for R. Browning) -- 06/13/2023.

## 2024-01-11 ENCOUNTER — Ambulatory Visit: Payer: PRIVATE HEALTH INSURANCE | Admitting: Neurology

## 2024-02-03 ENCOUNTER — Telehealth: Payer: Self-pay | Admitting: Neurology

## 2024-02-06 MED ORDER — BACLOFEN 20 MG PO TABS: 20.0000 mg | ORAL_TABLET | Freq: Three times a day (TID) | ORAL | 1 refills | Status: DC

## 2024-02-06 NOTE — Telephone Encounter (Signed)
 Pt last saw SS,NP 01/11/23. Next appt scheduled for 03/20/24. Per last note: Return in about 1 year (around 01/11/2024).  Appt with Dr. Vear was cx 01/11/24 d/t provider being out.   E-scribed refills as requested.

## 2024-02-06 NOTE — Telephone Encounter (Signed)
 Pt called  to Request medication refill    baclofen  (LIORESAL ) 20 MG tablet   Pt medication is to be sent to  [facility] [address] [place], [place] - [address] Phone: [phone]  Fax: [phone]

## 2024-03-20 ENCOUNTER — Encounter: Payer: Self-pay | Admitting: Neurology

## 2024-03-20 ENCOUNTER — Ambulatory Visit: Admitting: Neurology

## 2024-03-20 VITALS — BP 164/105 | HR 65 | Ht 66.0 in | Wt 186.0 lb

## 2024-03-20 DIAGNOSIS — N319 Neuromuscular dysfunction of bladder, unspecified: Secondary | ICD-10-CM | POA: Diagnosis not present

## 2024-03-20 DIAGNOSIS — G373 Acute transverse myelitis in demyelinating disease of central nervous system: Secondary | ICD-10-CM

## 2024-03-20 MED ORDER — BACLOFEN 20 MG PO TABS: 20.0000 mg | ORAL_TABLET | Freq: Three times a day (TID) | ORAL | 3 refills | Status: AC

## 2024-03-20 MED ORDER — MIRABEGRON ER 25 MG PO TB24: 25.0000 mg | ORAL_TABLET | Freq: Every day | ORAL | 5 refills | Status: AC

## 2024-03-20 NOTE — Patient Instructions (Signed)
 Needs to follow up with primary care, BP is high  Continue current medication  Hold off on Myrbetriq  until you see your primary care, can increase blood pressure Follow up in 1 year with Dr. Vear

## 2024-03-20 NOTE — Progress Notes (Signed)
 PATIENT: Kenneth Garza DOB: 1975-11-10  REASON FOR VISIT: follow up for Transverse Myelitis  HISTORY FROM: patient PRIMARY NEUROLOGIST: Willis/Sater  HISTORY OF PRESENT ILLNESS: Today 03/20/24 03/20/24 SS: Doing well, works about 50 hours a week at [employer], mostly at a desk, gets up to look for parts. He tried to wean off baclofen , he gets nauseated or stiff with heaviness in his arms and legs. No falls, getting around well. Does have urinary incontinence if he lays on his back. Would like to go back on medication, not sure why he stopped it. Oxybutynin  reduced his sex drive, go back to Myrbetriq , has urinary urgency. BP is up today, needs to see PCP. Still has sensory deficit from left mid back to left knee.  01/11/23 SS: Fracture left 3rd metatarsal, out of work until end of October, in a boot. Remains on baclofen  20 mg 3 times daily, when he misses it, he has nausea, tightness in his back and legs. Few times prior to foot injury, he would trip moving too fast. CTS is doing good. Bladder control is better, only 1 incontinent episode, does have more constipation, remains active, fishing, hunting. Lives with his significant other. Health has been good. Getting ready to get established at [facility]. Feels has remained stable.   10/07/21 SS: Kenneth Garza is here today for follow-up. Remains on Baclofen  20 mg 3 times daily. Without it feels stiffness in legs. Feels better with more movement. CTS is better, changed his job responsibilities, doesn't want to go through surgery. Bladder is better. Does have some constipation. When tired, hot, may have urinary accident. Feels symptoms worse on the right side. Had onset of symptoms in 2017. Doesn't exercise consistently. Is active, with working, yard work, fishing, architect. He just got a boat.   Update 10/01/20 SS: Kenneth Garza is a 48 year old male with history of transverse myelitis.  On baclofen  for lower extremity spasticity.  Has  neurogenic bladder, cannot tolerate oxybutynin  or Myrbetriq . Bladder is doing better, denies any incontinence. Had NCV/EMG that showed bilateral carpal tunnel worse on the right, was referred to a hand surgeon. Is waiting on this, until insurance changes.  His insurance will not pay for 90-day supply of baclofen .  He has noted if he misses a dose, he feels tightness in his lower extremities.  He continues to work full-time, has a office manager, agrees with him better than his prior physical work. The heat does make his symptoms worse. Is overall pleased with his condition. Getting a new kayak that isn't so low. Here today unaccompanied.  REVIEW OF SYSTEMS: Out of a complete 14 system review of symptoms, the patient complains only of the following symptoms, and all other reviewed systems are negative.  See HPI  ALLERGIES: No Known Allergies  HOME MEDICATIONS: Outpatient Medications Prior to Visit  Medication Sig Dispense Refill   magnesium oxide (MAG-OX) 400 MG tablet Take 400 mg by mouth daily.     baclofen  (LIORESAL ) 20 MG tablet Take 1 tablet (20 mg total) by mouth 3 (three) times daily. 90 tablet 1   oxyBUTYnin  (DITROPAN ) 5 MG/5ML solution Oral (Patient not taking: Reported on 03/20/2024)     No facility-administered medications prior to visit.    PAST MEDICAL HISTORY: Past Medical History:  Diagnosis Date   Bilateral carpal tunnel syndrome 11/05/2019   Neurogenic bladder 03/07/2016   Transverse myelitis (HCC)     PAST SURGICAL HISTORY: Past Surgical History:  Procedure Laterality Date   EPIDIDYMIS SURGERY  1987    FAMILY HISTORY: Family History  Problem Relation Age of Onset   Cancer Maternal Grandfather    Healthy Sister    Healthy Brother     SOCIAL HISTORY: Social History   Socioeconomic History   Marital status: Single    Spouse name: Not on file   Number of children: 1   Years of education: 12   Highest education level: Not on file  Occupational History    Occupation: Surveyor, Mining  Tobacco Use   Smoking status: Former    Current packs/day: 0.25    Types: Cigarettes   Smokeless tobacco: Never  Vaping Use   Vaping status: Every Day   Substances: Nicotine, Flavoring  Substance and Sexual Activity   Alcohol use: No    Alcohol/week: 0.0 standard drinks of alcohol    Comment: Occasional   Drug use: No    Comment: Occasional marijuana   Sexual activity: Yes    Birth control/protection: None  Other Topics Concern   Not on file  Social History Narrative   Lives at home w/ girlfriend, Eleanor   Right-handed   Occasionally drinks 12 oz Pepsi   Social Drivers of Corporate Investment Banker Strain: Not on file  Food Insecurity: Not on file  Transportation Needs: Not on file  Physical Activity: Not on file  Stress: Not on file  Social Connections: Not on file  Intimate Partner Violence: Not on file   PHYSICAL EXAM  Vitals:   03/20/24 1307  BP: (!) 164/105  Pulse: 65  SpO2: 98%  Weight: 186 lb (84.4 kg)  Height: 5' 6 (1.676 m)   Body mass index is 30.02 kg/m.  Generalized: Well developed, in no acute distress   Neurological examination  Mentation: Alert oriented to time, place, history taking. Follows all commands speech and language fluent Cranial nerve II-XII: Pupils were equal round reactive to light. Extraocular movements were full, visual field were full on confrontational test. Facial sensation and strength were normal. Head turning and shoulder shrug  were normal and symmetric. Motor: The motor testing reveals 5 over 5 strength of all 4 extremities.  Sensory: Sensory testing is intact to soft touch on all 4 extremities, but decreased to soft touch to left lateral thigh Coordination: Cerebellar testing reveals good finger-nose-finger and heel-to-shin bilaterally.  Gait and station: Gait is normal and steady  DIAGNOSTIC DATA (LABS, IMAGING, TESTING) - I reviewed patient records, labs, notes, testing and imaging  myself where available.  Lab Results  Component Value Date   WBC 5.4 08/19/2015   HGB 14.6 08/19/2015   HCT 42.6 08/19/2015   MCV 90.8 08/19/2015   PLT 230 08/19/2015      Component Value Date/Time   NA 142 08/19/2015 1056   K 4.0 08/19/2015 1056   CL 111 08/19/2015 1056   CO2 22 08/19/2015 1056   GLUCOSE 101 (H) 08/19/2015 1056   BUN 8 08/19/2015 1056   CREATININE 0.87 08/19/2015 1056   CALCIUM 9.3 08/19/2015 1056   PROT 6.7 08/19/2015 1056   ALBUMIN 4.1 08/19/2015 1056   AST 17 08/19/2015 1056   ALT 16 (L) 08/19/2015 1056   ALKPHOS 46 08/19/2015 1056   BILITOT 0.8 08/19/2015 1056   GFRNONAA >60 08/19/2015 1056   GFRAA >60 08/19/2015 1056   No results found for: CHOL, HDL, LDLCALC, LDLDIRECT, TRIG, CHOLHDL No results found for: YHAJ8R Lab Results  Component Value Date   VITAMINB12 490 09/02/2015   No results found for: TSH  ASSESSMENT AND PLAN 48 y.o. year old male  1.  History of transverse myelitis in 2017 2.  Neurogenic bladder 3.  Bilateral carpal tunnel syndrome  - Overall doing well, works full time, active  - Continue baclofen  20 mg 3 times daily for spasticity - BP is high, needs to follow-up with primary care, check at home, keep log - I will send in Myrbetriq  to retry for urinary incontinence/urgency, can increase BP, see PCP before starting  - Oxybutynin  resulted in sexual side effect  - Encouraged to establish a consistent exercise, stretching program - Follow-up in 1 year, will see Dr. Vear for 1 visit, I can continue to follow if needed or transition back to PCP  Lauraine Born, AGNP-C, DNP 03/20/2024, 1:36 PM [facility] [address], [address] [place], [place] [address] [phone]

## 2025-03-26 ENCOUNTER — Ambulatory Visit: Admitting: Neurology
